# Patient Record
Sex: Female | Born: 1955
Health system: Southern US, Community
[De-identification: ages and names within clinical notes are randomized; demographics above are authoritative.]

## PROBLEM LIST (undated history)

## (undated) DIAGNOSIS — R87619 Unspecified abnormal cytological findings in specimens from cervix uteri: Secondary | ICD-10-CM

## (undated) DIAGNOSIS — I10 Essential (primary) hypertension: Secondary | ICD-10-CM | POA: Insufficient documentation

## (undated) DIAGNOSIS — E7800 Pure hypercholesterolemia, unspecified: Secondary | ICD-10-CM

## (undated) DIAGNOSIS — F329 Major depressive disorder, single episode, unspecified: Secondary | ICD-10-CM

## (undated) DIAGNOSIS — B0221 Postherpetic geniculate ganglionitis: Secondary | ICD-10-CM | POA: Insufficient documentation

## (undated) DIAGNOSIS — F32A Depression, unspecified: Secondary | ICD-10-CM

## (undated) DIAGNOSIS — F419 Anxiety disorder, unspecified: Secondary | ICD-10-CM

## (undated) DIAGNOSIS — K219 Gastro-esophageal reflux disease without esophagitis: Secondary | ICD-10-CM

## (undated) DIAGNOSIS — K862 Cyst of pancreas: Secondary | ICD-10-CM | POA: Insufficient documentation

## (undated) DIAGNOSIS — R1084 Generalized abdominal pain: Secondary | ICD-10-CM | POA: Insufficient documentation

## (undated) HISTORY — PX: SPLENECTOMY, TOTAL: SHX788

## (undated) HISTORY — PX: PANCREAS BIOPSY: SHX1018

## (undated) HISTORY — DX: Pure hypercholesterolemia, unspecified: E78.00

## (undated) HISTORY — DX: Anxiety disorder, unspecified: F41.9

## (undated) HISTORY — DX: Unspecified abnormal cytological findings in specimens from cervix uteri: R87.619

## (undated) HISTORY — DX: Major depressive disorder, single episode, unspecified: F32.9

## (undated) HISTORY — DX: Depression, unspecified: F32.A

## (undated) HISTORY — DX: Essential (primary) hypertension: I10

## (undated) HISTORY — DX: Gastro-esophageal reflux disease without esophagitis: K21.9

## (undated) HISTORY — PX: TUBAL LIGATION: SHX77

## (undated) HISTORY — PX: OTHER SURGICAL HISTORY: SHX169

---

## 2012-08-17 DIAGNOSIS — K219 Gastro-esophageal reflux disease without esophagitis: Secondary | ICD-10-CM | POA: Insufficient documentation

## 2012-08-17 DIAGNOSIS — F418 Other specified anxiety disorders: Secondary | ICD-10-CM | POA: Insufficient documentation

## 2012-09-02 DIAGNOSIS — M79609 Pain in unspecified limb: Secondary | ICD-10-CM | POA: Insufficient documentation

## 2012-09-02 DIAGNOSIS — I70209 Unspecified atherosclerosis of native arteries of extremities, unspecified extremity: Secondary | ICD-10-CM | POA: Insufficient documentation

## 2013-12-08 LAB — HM COLONOSCOPY

## 2014-06-30 DIAGNOSIS — I779 Disorder of arteries and arterioles, unspecified: Secondary | ICD-10-CM | POA: Insufficient documentation

## 2014-12-20 DIAGNOSIS — F4310 Post-traumatic stress disorder, unspecified: Secondary | ICD-10-CM | POA: Diagnosis not present

## 2015-01-02 DIAGNOSIS — F4321 Adjustment disorder with depressed mood: Secondary | ICD-10-CM | POA: Diagnosis not present

## 2015-01-02 DIAGNOSIS — Z5181 Encounter for therapeutic drug level monitoring: Secondary | ICD-10-CM | POA: Diagnosis not present

## 2015-01-02 DIAGNOSIS — N200 Calculus of kidney: Secondary | ICD-10-CM | POA: Diagnosis not present

## 2015-01-02 DIAGNOSIS — I739 Peripheral vascular disease, unspecified: Secondary | ICD-10-CM | POA: Diagnosis not present

## 2015-01-02 DIAGNOSIS — Z79899 Other long term (current) drug therapy: Secondary | ICD-10-CM | POA: Diagnosis not present

## 2015-01-02 DIAGNOSIS — D649 Anemia, unspecified: Secondary | ICD-10-CM | POA: Diagnosis not present

## 2015-01-02 DIAGNOSIS — M545 Low back pain: Secondary | ICD-10-CM | POA: Diagnosis not present

## 2015-01-02 DIAGNOSIS — I10 Essential (primary) hypertension: Secondary | ICD-10-CM | POA: Diagnosis not present

## 2015-01-03 DIAGNOSIS — F331 Major depressive disorder, recurrent, moderate: Secondary | ICD-10-CM | POA: Diagnosis not present

## 2015-01-20 DIAGNOSIS — N201 Calculus of ureter: Secondary | ICD-10-CM | POA: Diagnosis not present

## 2015-01-20 DIAGNOSIS — I10 Essential (primary) hypertension: Secondary | ICD-10-CM | POA: Diagnosis not present

## 2015-01-20 DIAGNOSIS — R319 Hematuria, unspecified: Secondary | ICD-10-CM | POA: Diagnosis not present

## 2015-01-20 DIAGNOSIS — R109 Unspecified abdominal pain: Secondary | ICD-10-CM | POA: Diagnosis not present

## 2015-01-20 DIAGNOSIS — K088 Other specified disorders of teeth and supporting structures: Secondary | ICD-10-CM | POA: Diagnosis not present

## 2015-01-20 DIAGNOSIS — K029 Dental caries, unspecified: Secondary | ICD-10-CM | POA: Diagnosis not present

## 2015-02-12 DIAGNOSIS — I10 Essential (primary) hypertension: Secondary | ICD-10-CM | POA: Diagnosis not present

## 2015-02-12 DIAGNOSIS — E780 Pure hypercholesterolemia: Secondary | ICD-10-CM | POA: Diagnosis not present

## 2015-02-12 DIAGNOSIS — M545 Low back pain: Secondary | ICD-10-CM | POA: Diagnosis not present

## 2015-02-12 DIAGNOSIS — K210 Gastro-esophageal reflux disease with esophagitis: Secondary | ICD-10-CM | POA: Diagnosis not present

## 2015-03-13 DIAGNOSIS — M5136 Other intervertebral disc degeneration, lumbar region: Secondary | ICD-10-CM | POA: Diagnosis not present

## 2015-03-15 DIAGNOSIS — E780 Pure hypercholesterolemia: Secondary | ICD-10-CM | POA: Diagnosis not present

## 2015-03-15 DIAGNOSIS — M545 Low back pain: Secondary | ICD-10-CM | POA: Diagnosis not present

## 2015-03-15 DIAGNOSIS — K210 Gastro-esophageal reflux disease with esophagitis: Secondary | ICD-10-CM | POA: Diagnosis not present

## 2015-03-15 DIAGNOSIS — I10 Essential (primary) hypertension: Secondary | ICD-10-CM | POA: Diagnosis not present

## 2015-04-11 DIAGNOSIS — E780 Pure hypercholesterolemia: Secondary | ICD-10-CM | POA: Diagnosis not present

## 2015-04-11 DIAGNOSIS — F4310 Post-traumatic stress disorder, unspecified: Secondary | ICD-10-CM | POA: Diagnosis not present

## 2015-04-11 DIAGNOSIS — I10 Essential (primary) hypertension: Secondary | ICD-10-CM | POA: Diagnosis not present

## 2015-04-11 DIAGNOSIS — F331 Major depressive disorder, recurrent, moderate: Secondary | ICD-10-CM | POA: Diagnosis not present

## 2015-04-11 DIAGNOSIS — K210 Gastro-esophageal reflux disease with esophagitis: Secondary | ICD-10-CM | POA: Diagnosis not present

## 2015-04-11 DIAGNOSIS — M545 Low back pain: Secondary | ICD-10-CM | POA: Diagnosis not present

## 2015-05-07 DIAGNOSIS — R319 Hematuria, unspecified: Secondary | ICD-10-CM | POA: Diagnosis not present

## 2015-05-07 DIAGNOSIS — Z7902 Long term (current) use of antithrombotics/antiplatelets: Secondary | ICD-10-CM | POA: Diagnosis not present

## 2015-05-07 DIAGNOSIS — Z87442 Personal history of urinary calculi: Secondary | ICD-10-CM | POA: Diagnosis not present

## 2015-05-07 DIAGNOSIS — N390 Urinary tract infection, site not specified: Secondary | ICD-10-CM | POA: Diagnosis not present

## 2015-05-07 DIAGNOSIS — F17200 Nicotine dependence, unspecified, uncomplicated: Secondary | ICD-10-CM | POA: Diagnosis not present

## 2015-05-07 DIAGNOSIS — I70229 Atherosclerosis of native arteries of extremities with rest pain, unspecified extremity: Secondary | ICD-10-CM | POA: Diagnosis not present

## 2015-05-07 DIAGNOSIS — K219 Gastro-esophageal reflux disease without esophagitis: Secondary | ICD-10-CM | POA: Diagnosis not present

## 2015-05-07 DIAGNOSIS — M546 Pain in thoracic spine: Secondary | ICD-10-CM | POA: Diagnosis not present

## 2015-05-22 DIAGNOSIS — F4310 Post-traumatic stress disorder, unspecified: Secondary | ICD-10-CM | POA: Diagnosis not present

## 2015-05-22 DIAGNOSIS — F331 Major depressive disorder, recurrent, moderate: Secondary | ICD-10-CM | POA: Diagnosis not present

## 2015-05-28 DIAGNOSIS — E780 Pure hypercholesterolemia: Secondary | ICD-10-CM | POA: Diagnosis not present

## 2015-05-28 DIAGNOSIS — K210 Gastro-esophageal reflux disease with esophagitis: Secondary | ICD-10-CM | POA: Diagnosis not present

## 2015-05-28 DIAGNOSIS — I10 Essential (primary) hypertension: Secondary | ICD-10-CM | POA: Diagnosis not present

## 2015-05-28 DIAGNOSIS — M545 Low back pain: Secondary | ICD-10-CM | POA: Diagnosis not present

## 2015-06-04 DIAGNOSIS — I739 Peripheral vascular disease, unspecified: Secondary | ICD-10-CM | POA: Diagnosis not present

## 2015-06-04 DIAGNOSIS — M5432 Sciatica, left side: Secondary | ICD-10-CM | POA: Diagnosis not present

## 2015-06-25 DIAGNOSIS — Z113 Encounter for screening for infections with a predominantly sexual mode of transmission: Secondary | ICD-10-CM | POA: Diagnosis not present

## 2015-07-03 DIAGNOSIS — F4310 Post-traumatic stress disorder, unspecified: Secondary | ICD-10-CM | POA: Diagnosis not present

## 2015-07-03 DIAGNOSIS — F331 Major depressive disorder, recurrent, moderate: Secondary | ICD-10-CM | POA: Diagnosis not present

## 2015-07-06 DIAGNOSIS — R87612 Low grade squamous intraepithelial lesion on cytologic smear of cervix (LGSIL): Secondary | ICD-10-CM | POA: Diagnosis not present

## 2015-07-06 DIAGNOSIS — R6882 Decreased libido: Secondary | ICD-10-CM | POA: Diagnosis not present

## 2015-07-06 DIAGNOSIS — Z01419 Encounter for gynecological examination (general) (routine) without abnormal findings: Secondary | ICD-10-CM | POA: Diagnosis not present

## 2015-07-06 DIAGNOSIS — Z79890 Hormone replacement therapy (postmenopausal): Secondary | ICD-10-CM | POA: Diagnosis not present

## 2015-07-06 DIAGNOSIS — R87810 Cervical high risk human papillomavirus (HPV) DNA test positive: Secondary | ICD-10-CM | POA: Diagnosis not present

## 2015-07-09 DIAGNOSIS — R87619 Unspecified abnormal cytological findings in specimens from cervix uteri: Secondary | ICD-10-CM

## 2015-07-09 HISTORY — DX: Unspecified abnormal cytological findings in specimens from cervix uteri: R87.619

## 2015-07-19 DIAGNOSIS — Z1231 Encounter for screening mammogram for malignant neoplasm of breast: Secondary | ICD-10-CM | POA: Diagnosis not present

## 2015-08-04 DIAGNOSIS — R0789 Other chest pain: Secondary | ICD-10-CM | POA: Diagnosis not present

## 2015-08-04 DIAGNOSIS — F17210 Nicotine dependence, cigarettes, uncomplicated: Secondary | ICD-10-CM | POA: Diagnosis not present

## 2015-08-04 DIAGNOSIS — I10 Essential (primary) hypertension: Secondary | ICD-10-CM | POA: Diagnosis not present

## 2015-08-04 DIAGNOSIS — E780 Pure hypercholesterolemia: Secondary | ICD-10-CM | POA: Diagnosis not present

## 2015-08-04 DIAGNOSIS — R079 Chest pain, unspecified: Secondary | ICD-10-CM | POA: Diagnosis not present

## 2015-08-04 DIAGNOSIS — J984 Other disorders of lung: Secondary | ICD-10-CM | POA: Diagnosis not present

## 2015-08-04 DIAGNOSIS — K219 Gastro-esophageal reflux disease without esophagitis: Secondary | ICD-10-CM | POA: Diagnosis not present

## 2015-08-08 DIAGNOSIS — Z719 Counseling, unspecified: Secondary | ICD-10-CM | POA: Diagnosis not present

## 2015-08-08 DIAGNOSIS — R87810 Cervical high risk human papillomavirus (HPV) DNA test positive: Secondary | ICD-10-CM | POA: Diagnosis not present

## 2015-08-08 DIAGNOSIS — R87612 Low grade squamous intraepithelial lesion on cytologic smear of cervix (LGSIL): Secondary | ICD-10-CM | POA: Diagnosis not present

## 2015-08-08 DIAGNOSIS — N870 Mild cervical dysplasia: Secondary | ICD-10-CM | POA: Diagnosis not present

## 2015-08-15 DIAGNOSIS — Z206 Contact with and (suspected) exposure to human immunodeficiency virus [HIV]: Secondary | ICD-10-CM | POA: Diagnosis not present

## 2015-11-26 DIAGNOSIS — I779 Disorder of arteries and arterioles, unspecified: Secondary | ICD-10-CM | POA: Diagnosis not present

## 2015-11-26 DIAGNOSIS — I739 Peripheral vascular disease, unspecified: Secondary | ICD-10-CM | POA: Diagnosis not present

## 2016-01-04 ENCOUNTER — Ambulatory Visit: Payer: Self-pay | Admitting: Family Medicine

## 2016-01-04 DIAGNOSIS — R42 Dizziness and giddiness: Secondary | ICD-10-CM | POA: Diagnosis not present

## 2016-01-04 DIAGNOSIS — I10 Essential (primary) hypertension: Secondary | ICD-10-CM | POA: Diagnosis not present

## 2016-01-04 DIAGNOSIS — R110 Nausea: Secondary | ICD-10-CM | POA: Diagnosis not present

## 2016-01-04 DIAGNOSIS — E7800 Pure hypercholesterolemia, unspecified: Secondary | ICD-10-CM | POA: Diagnosis not present

## 2016-01-04 DIAGNOSIS — F418 Other specified anxiety disorders: Secondary | ICD-10-CM | POA: Diagnosis not present

## 2016-01-04 DIAGNOSIS — R1012 Left upper quadrant pain: Secondary | ICD-10-CM | POA: Diagnosis not present

## 2016-01-04 DIAGNOSIS — Z7982 Long term (current) use of aspirin: Secondary | ICD-10-CM | POA: Diagnosis not present

## 2016-01-04 DIAGNOSIS — Z79899 Other long term (current) drug therapy: Secondary | ICD-10-CM | POA: Diagnosis not present

## 2016-01-04 DIAGNOSIS — K219 Gastro-esophageal reflux disease without esophagitis: Secondary | ICD-10-CM | POA: Diagnosis not present

## 2016-01-04 DIAGNOSIS — I739 Peripheral vascular disease, unspecified: Secondary | ICD-10-CM | POA: Diagnosis not present

## 2016-01-04 DIAGNOSIS — Z9889 Other specified postprocedural states: Secondary | ICD-10-CM | POA: Diagnosis not present

## 2016-01-04 DIAGNOSIS — Z7902 Long term (current) use of antithrombotics/antiplatelets: Secondary | ICD-10-CM | POA: Diagnosis not present

## 2016-01-04 DIAGNOSIS — R143 Flatulence: Secondary | ICD-10-CM | POA: Diagnosis not present

## 2016-01-04 DIAGNOSIS — K2960 Other gastritis without bleeding: Secondary | ICD-10-CM | POA: Diagnosis not present

## 2016-01-04 DIAGNOSIS — R142 Eructation: Secondary | ICD-10-CM | POA: Diagnosis not present

## 2016-01-09 ENCOUNTER — Encounter: Payer: Self-pay | Admitting: Obstetrics and Gynecology

## 2016-01-15 LAB — HM PAP SMEAR: HM Pap smear: NEGATIVE

## 2016-01-16 ENCOUNTER — Ambulatory Visit (INDEPENDENT_AMBULATORY_CARE_PROVIDER_SITE_OTHER): Payer: Medicare Other | Admitting: Obstetrics and Gynecology

## 2016-01-16 ENCOUNTER — Encounter: Payer: Self-pay | Admitting: Obstetrics and Gynecology

## 2016-01-16 VITALS — BP 150/79 | HR 73 | Ht 65.5 in | Wt 166.6 lb

## 2016-01-16 DIAGNOSIS — R351 Nocturia: Secondary | ICD-10-CM | POA: Diagnosis not present

## 2016-01-16 DIAGNOSIS — A499 Bacterial infection, unspecified: Secondary | ICD-10-CM | POA: Diagnosis not present

## 2016-01-16 DIAGNOSIS — B9689 Other specified bacterial agents as the cause of diseases classified elsewhere: Secondary | ICD-10-CM

## 2016-01-16 DIAGNOSIS — Z0000 Encounter for general adult medical examination without abnormal findings: Secondary | ICD-10-CM | POA: Diagnosis not present

## 2016-01-16 DIAGNOSIS — N760 Acute vaginitis: Secondary | ICD-10-CM

## 2016-01-16 DIAGNOSIS — Z1239 Encounter for other screening for malignant neoplasm of breast: Secondary | ICD-10-CM

## 2016-01-16 DIAGNOSIS — N3289 Other specified disorders of bladder: Secondary | ICD-10-CM | POA: Diagnosis not present

## 2016-01-16 DIAGNOSIS — Z206 Contact with and (suspected) exposure to human immunodeficiency virus [HIV]: Secondary | ICD-10-CM

## 2016-01-16 DIAGNOSIS — Z1211 Encounter for screening for malignant neoplasm of colon: Secondary | ICD-10-CM

## 2016-01-16 DIAGNOSIS — Z01419 Encounter for gynecological examination (general) (routine) without abnormal findings: Secondary | ICD-10-CM

## 2016-01-16 DIAGNOSIS — R87612 Low grade squamous intraepithelial lesion on cytologic smear of cervix (LGSIL): Secondary | ICD-10-CM | POA: Diagnosis not present

## 2016-01-16 LAB — POCT URINALYSIS DIPSTICK
Bilirubin, UA: 1
CLARITY UA: NEGATIVE
Glucose, UA: NEGATIVE
KETONES UA: NEGATIVE
NITRITE UA: NEGATIVE
PH UA: 7
PROTEIN UA: NEGATIVE
Spec Grav, UA: 1.01
Urobilinogen, UA: 0.2

## 2016-01-16 MED ORDER — METRONIDAZOLE 500 MG PO TABS: 500.0000 mg | ORAL_TABLET | Freq: Two times a day (BID) | ORAL | Status: DC

## 2016-01-16 MED ORDER — OXYBUTYNIN CHLORIDE 5 MG PO TABS: 5.0000 mg | ORAL_TABLET | Freq: Two times a day (BID) | ORAL | Status: DC

## 2016-01-16 NOTE — Progress Notes (Signed)
Patient ID: Peggy Russo, female   DOB: 1956/07/01, 60 y.o.   MRN: [id number] ANNUAL PREVENTATIVE CARE GYN  ENCOUNTER NOTE  Subjective:       Peggy Russo is a 60 y.o. G29P4005 female here for a routine annual gynecologic exam.  Current complaints: 1.  Vaginal odor x2 weeks: Odor described as sour and fishy with white thick discharge. Denies vaginal bleeding, itching, irritation. Denies pain.  2. Nocturia x 2-3 weeks: states with frequent urination >10 times a night. Urge symptoms at night. No increase in frequency during the day. Denies dysuria, hematuria, trouble voiding. With history of stress symptoms, currently denies any leakage with coughing or laughing.   3. Nausea x2 weeks: Patient was seen at the emergency department due to nausea and unable to keep food down. Patient states symptoms could be related stress. ED ruled symptoms as gastritis and acid reflux. Patient is an everyday THC user, states uses THC to increase appetite and help keep food down. Patient's last colonoscopy 2 years ago with polyps.   4. Trouble sleeping: Patient has been experiencing insomnia for many years, feels that it has worsened in the past few weeks due to stress. Patient with history use of Sertraline, Mirtazapine and Trazodone.    Gynecologic History No LMP recorded. Patient is postmenopausal. Contraception: post menopausal status  Currently not sexually active Patient with HIV exposure; serial HIV testing is negative Last Pap: 2016. Results were: abnormal- per pt needed leep but did not get one Last mammogram: 2016. Results were: normal  Obstetric History OB History  Gravida Para Term Preterm AB SAB TAB Ectopic Multiple Living  5 4 4       5     # Outcome Date GA Lbr Len/2nd Weight Sex Delivery Anes PTL Lv  5 Term 1985   6 lb (2.722 kg) M CS-LTranv   Y  4 Term 1982   6 lb (2.722 kg) M Vag-Spont   Y  3 Term 1981   7 lb (3.175 kg) M Vag-Spont   Y  2 Gravida 1977   6 lb (2.722 kg) F Vag-Spont   Y  1 Term  1973   6 lb 2.1 oz (2.781 kg) M Vag-Spont   Y      Past Medical History  Diagnosis Date  . Hypertension   . Anxiety   . Depression   . GERD (gastroesophageal reflux disease)   . High cholesterol   . Abnormal Pap smear of cervix 07/2015    needed Leep- did not get one    Past Surgical History  Procedure Laterality Date  . Stent leg    . Splenectomy, total    . Cesarean section    . Tubal ligation      No current outpatient prescriptions on file prior to visit.   No current facility-administered medications on file prior to visit.    Allergies  Allergen Reactions  . Acetaminophen Itching    Patient states that she tolerates Percocet (oxycodone/APAP).  . Hydromorphone Other (See Comments)    Social History   Social History  . Marital Status: Legally Separated    Spouse Name: N/A  . Number of Children: N/A  . Years of Education: N/A   Occupational History  . Not on file.   Social History Main Topics  . Smoking status: Current Every Day Smoker -- 0.50 packs/day for 25 years    Types: Cigarettes  . Smokeless tobacco: Not on file  . Alcohol Use: No  . Drug Use:  Yes    Special: Marijuana     Comment: daily  . Sexual Activity: No   Other Topics Concern  . Not on file   Social History Narrative  . No narrative on file    Family History  Problem Relation Age of Onset  . Hypertension Mother   . Cancer Neg Hx   . Diabetes Neg Hx   . Ovarian cancer Neg Hx   . Heart disease Neg Hx     The following portions of the patient's history were reviewed and updated as appropriate: allergies, current medications, past family history, past medical history, past social history, past surgical history and problem list.  Review of Systems ROS- See above  Objective:   BP 150/79 mmHg  Pulse 73  Ht 5' 5.5" (1.664 m)  Wt 166 lb 9.6 oz (75.569 kg)  BMI 27.29 kg/m2 CONSTITUTIONAL: Well-developed, well-nourished female in no acute distress.  PSYCHIATRIC: Normal mood  and affect. Normal behavior. Normal judgment and thought content. [place]: Alert and oriented to person, place, and time. Normal muscle tone coordination. No cranial nerve deficit noted. HENT:  Normocephalic, atraumatic. Oropharynx is clear and moist NECK: Normal range of motion, supple, no masses.  Normal thyroid.  SKIN: Skin is warm and dry. No rash noted. Not diaphoretic. No erythema. No pallor. CARDIOVASCULAR: Normal heart rate noted, regular rhythm, no murmur. RESPIRATORY: Clear to auscultation bilaterally. Effort and breath sounds normal, no problems with respiration noted. BREASTS: Round, distinct, mobile and nontender BB sized mass at 4 o'clock beside nipple. Symmetric in size. No skin changes, nipple drainage, or lymphadenopathy. Stable per patient history. ABDOMEN: Soft, normal bowel sounds, no distention noted.  No tenderness, rebound or guarding.  BLADDER: Normal PELVIC:  External Genitalia: Normal  BUS: Normal  Vagina: Normal  Cervix: Normal. White discharge. Positive whiff test.  Uterus: Normal. Midline, mobile, nontender.   Adnexa: Normal  RV: External Exam NormaI, No Rectal Masses and Normal Sphincter tone  LYMPHATIC: No Axillary, Supraclavicular, or Inguinal Adenopathy.  PROCEDURE:  Wet prep. KOH-negative; positive whiff test Normal saline-positive clue cells; few white blood cells; no Trichomonas     Assessment:   1. Annual/Wellness examination - Pap Cotest - Breast cancer screening - Colon cancer screening  2. Vaginal Malodor- Bacterial vaginosis - Positive Whiff test - Wet prep: positive for clue cells  3. Nocturia - UA and cultures to rule out cystitis - Trial of Oxybutynin for symptoms of over active bladder  4. HIV exposure - Blood drawn today   Plan:  1. Pap: Pap Co Test 2. Mammogram: Ordered 3. Stool Guaiac Testing:  Ordered 4. Labs: HIV testing 5. Start Flagyl 500mg  BID PO x 7 days for bacterial vaginosis 6. Start Oxybutynin 5mg  BID PO  for overactive bladder symptoms. 7. Side effects of medicines discussed in detail with patient 8. Routine preventative health maintenance measures emphasized: Exercise/Diet/Weight control, Tobacco Warnings, Alcohol/Substance use risks, Stress Management, Peer Pressure Issues and Safe Sex 9. Follow up in 6 weeks for management 10.  Annual exam 1 year  Joyice Faster, CMA Amy Tasia Catchings, PA-S Brayton Mars, MD   I have seen, interviewed, and examined the patient in conjunction with the [facility].A. student and affirm the diagnosis and management plan. [place] A. DeFrancesco, MD, FACOG   Note: This dictation was prepared with Dragon dictation along with smaller phrase technology. Any transcriptional errors that result from this process are unintentional.

## 2016-01-17 LAB — HIV ANTIBODY (ROUTINE TESTING W REFLEX): HIV SCREEN 4TH GENERATION: NONREACTIVE

## 2016-01-17 LAB — URINE CULTURE

## 2016-01-21 DIAGNOSIS — B9689 Other specified bacterial agents as the cause of diseases classified elsewhere: Secondary | ICD-10-CM | POA: Insufficient documentation

## 2016-01-21 DIAGNOSIS — N760 Acute vaginitis: Secondary | ICD-10-CM

## 2016-01-21 DIAGNOSIS — I70209 Unspecified atherosclerosis of native arteries of extremities, unspecified extremity: Secondary | ICD-10-CM | POA: Insufficient documentation

## 2016-01-21 DIAGNOSIS — R351 Nocturia: Secondary | ICD-10-CM | POA: Insufficient documentation

## 2016-01-21 NOTE — Patient Instructions (Signed)
1.  Pap smear is done today. 2.  Mammogram is ordered. 3.  Stool guaiac cards are given. 4.  Begin oxybutynin 5 mg twice a day. 6.  HIV testing is done today. 7.  Return in 6 weeks for follow-up on oxybutynin. 8.  Return one year for annual exam

## 2016-01-22 ENCOUNTER — Ambulatory Visit (INDEPENDENT_AMBULATORY_CARE_PROVIDER_SITE_OTHER): Payer: Medicare Other | Admitting: Family Medicine

## 2016-01-22 ENCOUNTER — Encounter: Payer: Self-pay | Admitting: Family Medicine

## 2016-01-22 ENCOUNTER — Other Ambulatory Visit: Payer: Self-pay | Admitting: Family Medicine

## 2016-01-22 VITALS — BP 131/79 | HR 69 | Temp 98.4°F | Resp 16 | Ht 65.5 in | Wt 171.0 lb

## 2016-01-22 DIAGNOSIS — R5382 Chronic fatigue, unspecified: Secondary | ICD-10-CM | POA: Diagnosis not present

## 2016-01-22 DIAGNOSIS — I10 Essential (primary) hypertension: Secondary | ICD-10-CM

## 2016-01-22 DIAGNOSIS — N3281 Overactive bladder: Secondary | ICD-10-CM

## 2016-01-22 DIAGNOSIS — K219 Gastro-esophageal reflux disease without esophagitis: Secondary | ICD-10-CM

## 2016-01-22 DIAGNOSIS — F329 Major depressive disorder, single episode, unspecified: Secondary | ICD-10-CM

## 2016-01-22 DIAGNOSIS — I70209 Unspecified atherosclerosis of native arteries of extremities, unspecified extremity: Secondary | ICD-10-CM

## 2016-01-22 DIAGNOSIS — E559 Vitamin D deficiency, unspecified: Secondary | ICD-10-CM | POA: Diagnosis not present

## 2016-01-22 DIAGNOSIS — G4700 Insomnia, unspecified: Secondary | ICD-10-CM | POA: Diagnosis not present

## 2016-01-22 DIAGNOSIS — F32A Depression, unspecified: Secondary | ICD-10-CM

## 2016-01-22 MED ORDER — MIRTAZAPINE 15 MG PO TABS: 15.0000 mg | ORAL_TABLET | Freq: Every day | ORAL | Status: DC

## 2016-01-22 MED ORDER — TOLTERODINE TARTRATE ER 2 MG PO CP24: 2.0000 mg | ORAL_CAPSULE | Freq: Every day | ORAL | Status: DC

## 2016-01-22 MED ORDER — CLOPIDOGREL BISULFATE 75 MG PO TABS: 75.0000 mg | ORAL_TABLET | Freq: Once | ORAL | Status: DC

## 2016-01-22 MED ORDER — AMLODIPINE BESYLATE 10 MG PO TABS: 10.0000 mg | ORAL_TABLET | Freq: Every day | ORAL | Status: DC

## 2016-01-22 MED ORDER — PANTOPRAZOLE SODIUM 20 MG PO TBEC: 20.0000 mg | DELAYED_RELEASE_TABLET | Freq: Every day | ORAL | Status: DC

## 2016-01-22 MED ORDER — SIMVASTATIN 20 MG PO TABS: 20.0000 mg | ORAL_TABLET | Freq: Every day | ORAL | Status: DC

## 2016-01-22 NOTE — Assessment & Plan Note (Signed)
Renewed Protonix. Alarm symptoms reviewed.

## 2016-01-22 NOTE — Assessment & Plan Note (Addendum)
Controlled in the office today. Continue amlodipine daily. CMET. Reviewed DASH diet.  Recheck 3 mos.

## 2016-01-22 NOTE — Patient Instructions (Signed)
Bladder issues: Try stopping drinking large amounts after 7pm to see if that helps you void less. Change to Detrol 2mg  at bedtime to help with your symptoms.  We will check your Vitamin D and TSH levels for your fatigue.  It may also be depression- we can plan to restart Zoloft if those are normal.   Your goal blood pressure is 140/90 Work on low salt/sodium diet - goal <1.5gm (1,500mg ) per day. Eat a diet high in fruits/vegetables and whole grains.  Look into mediterranean and DASH diet. Goal activity is 128min/wk of moderate intensity exercise.  This can be split into 30 minute chunks.  If you are not at this level, you can start with smaller 10-15 min increments and slowly build up activity. Look at [employer] for more resources   Please seek immediate medical attention at ER or Urgent Care if you develop: Chest pain, pressure or tightness. Shortness of breath accompanied by nausea or diaphoresis Visual changes Numbness or tingling on one side of the body Facial droop Altered mental status Or any concerning symptoms.

## 2016-01-22 NOTE — Assessment & Plan Note (Signed)
Pt stopped Zoloft on her own. Suspect fatigue may be depression related. Will check TSH and vitamin D to r/o deficiency and discuss restarting Zoloft.

## 2016-01-22 NOTE — Assessment & Plan Note (Signed)
Renewed remeron for sleep.

## 2016-01-22 NOTE — Progress Notes (Signed)
Subjective:    Patient ID: Peggy Russo, female    DOB: [birth year], 60 y.o.   MRN: [id number]  HPI: Peggy Russo is a 60 y.o. female presenting on 01/22/2016 for Establish Care   HPI  Pt presents to establish care today. Previous care provider was Dr. Enzo Bi and PCP in [facility].  It has been 3 months since Her last PCP visit. Records from previous provider will be requested and reviewed. Current medical problems include:  Hypertension: Diagnosed in 2009. Taking amlodipine 10mg  daily. Works well for her. No chest pain, shortness of breath.  Nocturia:x2 weeks- doesn't sleep well due to needing to use bathroom. Was given antibiotic by GYN- no change in symptoms. Started on oxybutinin- worked for a few days and then symptoms restarted. Symptoms include urgency, waking 4-5 times per night to void. Normal urine. Trying to drink water- has increased water since her GYN visit. Stops drinking around 9pm. Goes to bed at 11-12pm.   Fatigue- feels lifeless and drained of energy GERD: On protonix 20mg - doing well. Previously had nausea. Was on zofran, stopped since started protonix.  PAD: Taking plavix for PAD. Taking aspirin. Sees vascular specialist in [place]. Diagnosed 4 years ago.  Stent in [place]. Saw vascular every year. Not on a statin.  Neurology- has appt  Friday in [place] for nerve issues and numbness LLE. Sitting and goes numb. Vascular sent her to neurology. Depression: Started on Zoloft- when she got married 2.5 years ago. Feels like it is helping- not sure that she needed. She has recently left her husband and anxiety stopped. Stopped zoloft 1 mos ago.  Sleep- Taking remeron for sleep 15mg  at bedtime. Worked well in the past.   Health maintenance:  GYN: Pap done last month. Previous LSIL.  Mammogram ordered by GYN.  Colonscopy- scheduled for next month.   Past Medical History  Diagnosis Date  . Hypertension   . Anxiety   . Depression   . GERD (gastroesophageal  reflux disease)   . High cholesterol   . Abnormal Pap smear of cervix 07/2015    needed Leep- did not get one   Social History   Social History  . Marital Status: Legally Separated    Spouse Name: N/A  . Number of Children: N/A  . Years of Education: N/A   Occupational History  . Not on file.   Social History Main Topics  . Smoking status: Current Every Day Smoker -- 0.50 packs/day for 25 years    Types: Cigarettes  . Smokeless tobacco: Not on file  . Alcohol Use: No  . Drug Use: Yes    Special: Marijuana     Comment: daily  . Sexual Activity: No   Other Topics Concern  . Not on file   Social History Narrative   Family History  Problem Relation Age of Onset  . Hypertension Mother   . Cancer Neg Hx   . Diabetes Neg Hx   . Ovarian cancer Neg Hx   . Heart disease Neg Hx    Current Outpatient Prescriptions on File Prior to Visit  Medication Sig  . aspirin EC 81 MG tablet Take by mouth.  . metroNIDAZOLE (FLAGYL) 500 MG tablet Take 1 tablet (500 mg total) by mouth 2 (two) times daily.  . sertraline (ZOLOFT) 50 MG tablet    No current facility-administered medications on file prior to visit.    Review of Systems  Constitutional: Positive for fatigue. Negative for fever and chills.  HENT:  Negative.   Respiratory: Negative for cough, chest tightness and wheezing.   Cardiovascular: Negative for chest pain, palpitations and leg swelling.  Gastrointestinal: Negative for nausea, vomiting, abdominal pain, diarrhea and constipation.  Endocrine: Negative.  Negative for cold intolerance, heat intolerance, polydipsia, polyphagia and polyuria.  Genitourinary: Positive for urgency and frequency. Negative for dysuria, vaginal bleeding, vaginal discharge, difficulty urinating, vaginal pain, pelvic pain and dyspareunia.  Musculoskeletal: Negative.   Skin: Negative for color change.  Neurological: Negative for dizziness, light-headedness and numbness.  Psychiatric/Behavioral:  Positive for sleep disturbance.   Per HPI unless specifically indicated above     Objective:    BP 131/79 mmHg  Pulse 69  Temp(Src) 98.4 F (36.9 C) (Oral)  Resp 16  Ht 5' 5.5" (1.664 m)  Wt 171 lb (77.565 kg)  BMI 28.01 kg/m2  Wt Readings from Last 3 Encounters:  01/22/16 171 lb (77.565 kg)  01/16/16 166 lb 9.6 oz (75.569 kg)    Physical Exam  Constitutional: She is oriented to person, place, and time. She appears well-developed and well-nourished.  HENT:  Head: Normocephalic and atraumatic.  Neck: Neck supple.  Cardiovascular: Normal rate, regular rhythm and normal heart sounds.  Exam reveals no gallop and no friction rub.   No murmur heard. Pulmonary/Chest: Effort normal and breath sounds normal. She has no wheezes. She exhibits no tenderness.  Abdominal: Soft. Normal appearance and bowel sounds are normal. She exhibits no distension and no mass. There is no tenderness. There is no rebound, no guarding and no CVA tenderness.  Musculoskeletal: Normal range of motion. She exhibits no edema or tenderness.  Lymphadenopathy:    She has no cervical adenopathy.  Neurological: She is alert and oriented to person, place, and time.  Skin: Skin is warm and dry.   Results for orders placed or performed in visit on 01/22/16  HM PAP SMEAR  Result Value Ref Range   HM Pap smear negative   HM COLONOSCOPY  Result Value Ref Range   HM Colonoscopy polyp       Assessment & Plan:   Problem List Items Addressed This Visit      Cardiovascular and Mediastinum   Arteriosclerosis of artery of extremity ([place])    Followed by vascular surgery. Pt would like to see local vascular surgeon. Just had follow up last month- need recheck in 1 year.  She will call when she needs referral to local surgeon.       Relevant Medications   amLODipine (NORVASC) 10 MG tablet   simvastatin (ZOCOR) 20 MG tablet   clopidogrel (PLAVIX) 75 MG tablet   Other Relevant Orders   Lipid panel   Benign  essential HTN - Primary    Controlled in the office today. Continue amlodipine daily. CMET. Reviewed DASH diet.  Recheck 3 mos.       Relevant Medications   amLODipine (NORVASC) 10 MG tablet   simvastatin (ZOCOR) 20 MG tablet   Other Relevant Orders   Comprehensive metabolic panel     Digestive   Acid reflux    Renewed Protonix. Alarm symptoms reviewed.       Relevant Medications   pantoprazole (PROTONIX) 20 MG tablet     Other   Clinical depression    Pt stopped Zoloft on her own. Suspect fatigue may be depression related. Will check TSH and vitamin D to r/o deficiency and discuss restarting Zoloft.       Relevant Medications   mirtazapine (REMERON) 15 MG tablet   Vitamin  D deficiency    Recheck vitamin D levels for fatigue.       Relevant Orders   VITAMIN D 25 Hydroxy (Vit-D Deficiency, Fractures)   Insomnia    Renewed remeron for sleep.        Other Visit Diagnoses    Overactive bladder        ditropan is not helping- change to detrol LA to see if symptoms can be controlled. Consider urology referral if not improving.     Relevant Medications    tolterodine (DETROL LA) 2 MG 24 hr capsule    Chronic fatigue        Relevant Orders    VITAMIN D 25 Hydroxy (Vit-D Deficiency, Fractures)    TSH       Meds ordered this encounter  Medications  . tolterodine (DETROL LA) 2 MG 24 hr capsule    Sig: Take 1 capsule (2 mg total) by mouth daily.    Dispense:  30 capsule    Refill:  11    Order Specific Question:  Supervising Provider    Answer:  Arlis Porta [phone]  . pantoprazole (PROTONIX) 20 MG tablet    Sig: Take 1 tablet (20 mg total) by mouth daily.    Dispense:  90 tablet    Refill:  3    Order Specific Question:  Supervising Provider    Answer:  Arlis Porta [phone]  . amLODipine (NORVASC) 10 MG tablet    Sig: Take 1 tablet (10 mg total) by mouth daily.    Dispense:  90 tablet    Refill:  3    Order Specific Question:  Supervising  Provider    Answer:  Arlis Porta [phone]  . simvastatin (ZOCOR) 20 MG tablet    Sig: Take 1 tablet (20 mg total) by mouth daily at 6 PM.    Dispense:  90 tablet    Refill:  3    Order Specific Question:  Supervising Provider    Answer:  Arlis Porta [phone]  . clopidogrel (PLAVIX) 75 MG tablet    Sig: Take 1 tablet (75 mg total) by mouth once.    Dispense:  90 tablet    Refill:  3    Order Specific Question:  Supervising Provider    Answer:  Arlis Porta [phone]  . mirtazapine (REMERON) 15 MG tablet    Sig: Take 1 tablet (15 mg total) by mouth at bedtime.    Dispense:  90 tablet    Refill:  3    Order Specific Question:  Supervising Provider    Answer:  Arlis Porta [phone]      Follow up plan: Return in about 4 weeks (around 02/19/2016) for bladder.Marland Kitchen

## 2016-01-22 NOTE — Assessment & Plan Note (Signed)
Followed by vascular surgery. Pt would like to see local vascular surgeon. Just had follow up last month- need recheck in 1 year.  She will call when she needs referral to local surgeon.

## 2016-01-22 NOTE — Assessment & Plan Note (Signed)
Recheck vitamin D levels for fatigue.

## 2016-01-25 DIAGNOSIS — G5712 Meralgia paresthetica, left lower limb: Secondary | ICD-10-CM | POA: Diagnosis not present

## 2016-01-25 LAB — PAP IG AND HPV HIGH-RISK
HPV, high-risk: POSITIVE — AB
PAP SMEAR COMMENT: 0

## 2016-01-27 DIAGNOSIS — Z1211 Encounter for screening for malignant neoplasm of colon: Secondary | ICD-10-CM | POA: Diagnosis not present

## 2016-01-30 ENCOUNTER — Telehealth: Payer: Self-pay | Admitting: Family Medicine

## 2016-01-30 NOTE — Telephone Encounter (Signed)
Called pt and she doesn't have any preference do you want this referral to be placed ?

## 2016-01-30 NOTE — Telephone Encounter (Signed)
Pt saw a neurologist at [facility] and was told to follow up with a neurologist in [facility].  Please call [phone]

## 2016-01-31 NOTE — Telephone Encounter (Signed)
Well she only has one option and that is [facility].  I will placed a referral for her but we will need office notes from the [facility] neurologist and the diagnosis for which they want her to be seen.  Please call her to obtain that information and I will placed the referral. Thanks! AK

## 2016-01-31 NOTE — Telephone Encounter (Signed)
Called pt and told her that she will need to fax [place] notes before we schedule her appointment and also she wanted the Rx for bacterial infection to be send and she was here recently and got pap smear done. When question about having infection while she had seen her reply was she just got infection today and I advised her that she needs to be seen for any infection and any Rx for bacterial infection Nisha

## 2016-01-31 NOTE — Telephone Encounter (Signed)
Did she give [place] the name of the practice so we can request records? I know she was going for leg numbness but it was touched on briefly in her visit due to her having an existing neurology referral.  Thanks for the FYI about infection- I agree she needs to be seen.

## 2016-02-01 NOTE — Telephone Encounter (Signed)
Records from previous neurologist received and will be placed on A.Krebs desk.

## 2016-02-04 ENCOUNTER — Other Ambulatory Visit: Payer: Self-pay | Admitting: Family Medicine

## 2016-02-04 DIAGNOSIS — G5712 Meralgia paresthetica, left lower limb: Secondary | ICD-10-CM

## 2016-02-04 LAB — FECAL OCCULT BLOOD, IMMUNOCHEMICAL: Fecal Occult Bld: NEGATIVE

## 2016-02-05 ENCOUNTER — Telehealth: Payer: Self-pay | Admitting: Obstetrics and Gynecology

## 2016-02-05 MED ORDER — METRONIDAZOLE 500 MG PO TABS: 500.0000 mg | ORAL_TABLET | Freq: Two times a day (BID) | ORAL | Status: DC

## 2016-02-05 NOTE — Telephone Encounter (Signed)
Pt was seen 2/8 for ww exam. Dx with bv. Given flagyl. Pt states she completed all meds. Now she is having a fishy odor. NO d/c. Advised I will send in flagyl again. If she completes meds and sx reoccur she will need to be seen.

## 2016-02-05 NOTE — Telephone Encounter (Signed)
Patient called stating she has a "fishy" odor and didn't know why. She would like a call back.Thanks

## 2016-02-22 DIAGNOSIS — I70209 Unspecified atherosclerosis of native arteries of extremities, unspecified extremity: Secondary | ICD-10-CM | POA: Diagnosis not present

## 2016-02-22 DIAGNOSIS — R5382 Chronic fatigue, unspecified: Secondary | ICD-10-CM | POA: Diagnosis not present

## 2016-02-22 DIAGNOSIS — E559 Vitamin D deficiency, unspecified: Secondary | ICD-10-CM | POA: Diagnosis not present

## 2016-02-22 DIAGNOSIS — Z789 Other specified health status: Secondary | ICD-10-CM | POA: Diagnosis not present

## 2016-02-22 LAB — BASIC METABOLIC PANEL
Creatinine: 1 mg/dL (ref <=1.1)
GLUCOSE: 97 mg/dL

## 2016-02-22 LAB — HEPATIC FUNCTION PANEL
ALT: 27 U/L (ref 7–35)
AST: 15 U/L (ref 13–35)

## 2016-02-22 LAB — LIPID PANEL
CHOLESTEROL: 295 mg/dL — AB (ref 0–200)
HDL: 74 mg/dL — AB (ref 35–70)
LDL Cholesterol: 180 mg/dL
TRIGLYCERIDES: 203 mg/dL — AB (ref 40–160)

## 2016-02-26 ENCOUNTER — Other Ambulatory Visit: Payer: Self-pay | Admitting: Family Medicine

## 2016-02-26 ENCOUNTER — Ambulatory Visit (INDEPENDENT_AMBULATORY_CARE_PROVIDER_SITE_OTHER): Payer: Medicare Other | Admitting: Family Medicine

## 2016-02-26 ENCOUNTER — Ambulatory Visit
Admission: RE | Admit: 2016-02-26 | Discharge: 2016-02-26 | Disposition: A | Discharge status: Home / Self Care | Payer: Medicare Other | Source: Ambulatory Visit | Attending: Family Medicine | Admitting: Family Medicine

## 2016-02-26 ENCOUNTER — Encounter: Payer: Self-pay | Admitting: Family Medicine

## 2016-02-26 VITALS — BP 137/78 | HR 89 | Temp 98.7°F | Resp 16 | Wt 169.4 lb

## 2016-02-26 DIAGNOSIS — R42 Dizziness and giddiness: Secondary | ICD-10-CM

## 2016-02-26 DIAGNOSIS — K5900 Constipation, unspecified: Secondary | ICD-10-CM | POA: Diagnosis not present

## 2016-02-26 DIAGNOSIS — R1013 Epigastric pain: Secondary | ICD-10-CM | POA: Diagnosis not present

## 2016-02-26 DIAGNOSIS — R351 Nocturia: Secondary | ICD-10-CM

## 2016-02-26 DIAGNOSIS — K5909 Other constipation: Secondary | ICD-10-CM

## 2016-02-26 DIAGNOSIS — R0789 Other chest pain: Secondary | ICD-10-CM | POA: Diagnosis not present

## 2016-02-26 DIAGNOSIS — I70209 Unspecified atherosclerosis of native arteries of extremities, unspecified extremity: Secondary | ICD-10-CM | POA: Diagnosis not present

## 2016-02-26 DIAGNOSIS — R109 Unspecified abdominal pain: Secondary | ICD-10-CM | POA: Diagnosis not present

## 2016-02-26 MED ORDER — ATORVASTATIN CALCIUM 40 MG PO TABS
40.0000 mg | ORAL_TABLET | Freq: Every day | ORAL | Status: DC
Start: 2016-02-26 — End: 2017-02-02

## 2016-02-26 MED ORDER — LUBIPROSTONE 8 MCG PO CAPS
8.0000 ug | ORAL_CAPSULE | Freq: Two times a day (BID) | ORAL | Status: DC
Start: 2016-02-26 — End: 2016-12-10

## 2016-02-26 MED ORDER — BISACODYL 5 MG PO TBEC: 5.0000 mg | DELAYED_RELEASE_TABLET | Freq: Every day | ORAL | Status: DC | PRN

## 2016-02-26 NOTE — Progress Notes (Signed)
Subjective:    Patient ID: Peggy Russo, female    DOB: [birth year], 60 y.o.   MRN: [id number]  HPI: [name] is a 60 y.o. female presenting on 02/26/2016 for Follow-up   HPI  Pt presents for recheck of overactive bladder symptoms. Pt reporting only voiding once per day. Symptoms present since 2009. Drinking plenty of fluids- drinks 4 bottles of water per day. Voiding still >10 times per night. Detrol has not helped her symptoms.  Doesn't feel the urge to go during the day. Urine culture was negative for infection at 2/8 visit with GYN. Doesn't hurt to void. No hematuria. No low back pain. Also having constipation- not taking anything. Was previously on miralax. Stool softener didn't help. BM's occur once per week. Strains to get them out. Has been impacted in the past.   Epigastric pain and L sided stabbing chest pain. Symptoms present x 1 week. Occur 3 times per day. No pattern with meals. Doesn't eat much. Stabbing pain is always on the L side. Pain goes away on own. Occurs at rest and with activity. Pt has known PVD.  Past Medical History  Diagnosis Date  . Hypertension   . Anxiety   . Depression   . GERD (gastroesophageal reflux disease)   . High cholesterol   . Abnormal Pap smear of cervix 07/2015    needed Leep- did not get one    Current Outpatient Prescriptions on File Prior to Visit  Medication Sig  . amLODipine (NORVASC) 10 MG tablet Take 1 tablet (10 mg total) by mouth daily.  Marland Kitchen aspirin EC 81 MG tablet Take by mouth.  . clopidogrel (PLAVIX) 75 MG tablet Take 1 tablet (75 mg total) by mouth once.  . tolterodine (DETROL LA) 2 MG 24 hr capsule Take 1 capsule (2 mg total) by mouth daily.  . mirtazapine (REMERON) 15 MG tablet Take 1 tablet (15 mg total) by mouth at bedtime. (Patient not taking: Reported on 02/26/2016)  . pantoprazole (PROTONIX) 20 MG tablet Take 1 tablet (20 mg total) by mouth daily. (Patient not taking: Reported on 02/26/2016)  . sertraline (ZOLOFT) 50 MG tablet  Reported on 02/26/2016   No current facility-administered medications on file prior to visit.    Review of Systems  Constitutional: Negative for fever and chills.  HENT: Negative.   Respiratory: Negative for cough, chest tightness and wheezing.   Cardiovascular: Positive for chest pain. Negative for palpitations and leg swelling.  Gastrointestinal: Positive for abdominal pain, constipation and abdominal distention. Negative for nausea, vomiting and diarrhea.  Endocrine: Negative.  Negative for cold intolerance, heat intolerance, polydipsia, polyphagia and polyuria.  Genitourinary: Positive for frequency (nocturia) and difficulty urinating. Negative for dysuria.  Musculoskeletal: Negative.  Negative for myalgias and back pain.  Neurological: Negative for dizziness, light-headedness and numbness.  Psychiatric/Behavioral: Negative.    Per HPI unless specifically indicated above     Objective:    BP 137/78 mmHg  Pulse 89  Temp(Src) 98.7 F (37.1 C) (Oral)  Resp 16  Wt 169 lb 6.4 oz (76.839 kg)  SpO2 99%  Wt Readings from Last 3 Encounters:  02/26/16 169 lb 6.4 oz (76.839 kg)  01/22/16 171 lb (77.565 kg)  01/16/16 166 lb 9.6 oz (75.569 kg)    Physical Exam  Constitutional: She is oriented to person, place, and time. She appears well-developed and well-nourished.  HENT:  Head: Normocephalic and atraumatic.  Neck: Neck supple.  Cardiovascular: Normal rate, regular rhythm and normal heart sounds.  Exam  reveals no gallop and no friction rub.   No murmur heard. Pulmonary/Chest: Effort normal and breath sounds normal. She has no wheezes. She exhibits no tenderness.  Abdominal: Soft. Normal appearance and bowel sounds are normal. She exhibits no distension and no mass. There is no splenomegaly or hepatomegaly. There is tenderness in the right lower quadrant and left lower quadrant. There is no rigidity, no rebound, no guarding and no CVA tenderness.  Musculoskeletal: Normal range of  motion. She exhibits no edema or tenderness.  Lymphadenopathy:    She has no cervical adenopathy.  Neurological: She is alert and oriented to person, place, and time.  Skin: Skin is warm and dry.   Results for orders placed or performed in visit on [id number]  Basic metabolic panel  Result Value Ref Range   Glucose 97 mg/dL   Creatinine 1.0 .5 - 1.1 mg/dL  Lipid panel  Result Value Ref Range   Triglycerides 203 (A) 40 - 160 mg/dL   Cholesterol 295 (A) 0 - 200 mg/dL   HDL 74 (A) 35 - 70 mg/dL   LDL Cholesterol 180 mg/dL  Hepatic function panel  Result Value Ref Range   ALT 27 7 - 35 U/L   AST 15 13 - 35 U/L      Assessment & Plan:   Problem List Items Addressed This Visit      Cardiovascular and Mediastinum   Arteriosclerosis of artery of extremity ([place])    Change to high intensity statin due to PVD and elevated LDL. Managed by vein and vascular. Continue plavix.       Relevant Medications   atorvastatin (LIPITOR) 40 MG tablet   Other Relevant Orders   Ambulatory referral to Cardiology     Digestive   Chronic constipation    KUB to determine stool burden given distension and poor appetite. Start amitiza to help with symptoms. Encouraged use of benefiber PRN to help increase regularity. Recheck 1 mos.       Relevant Medications   lubiprostone (AMITIZA) 8 MCG capsule   Other Relevant Orders   DG Abd 1 View     Other   Nocturia    Likely 2/2 not voiding during the day. Trial of bladder training- void every 2-3 hours during the day to reduce nighttime symptoms. Limit fluids prior to bed. Continue detrol. Consider urology if symptoms not improved. Recheck 1 mos.        Other Visit Diagnoses    Atypical chest pain    -  Primary    ECG WNL. However given chest pain at rest and known ASCVD r/o cardiac causes of chest pain. Likely GERD vs. MSK. Alarm symptoms reviewed.     Relevant Orders    EKG 12-Lead    Ambulatory referral to Cardiology       Meds ordered this  encounter  Medications  . atorvastatin (LIPITOR) 40 MG tablet    Sig: Take 1 tablet (40 mg total) by mouth daily.    Dispense:  90 tablet    Refill:  3    Order Specific Question:  Supervising Provider    Answer:  Arlis Porta [phone]  . lubiprostone (AMITIZA) 8 MCG capsule    Sig: Take 1 capsule (8 mcg total) by mouth 2 (two) times daily with a meal.    Dispense:  60 capsule    Refill:  11    Order Specific Question:  Supervising Provider    Answer:  Arlis Porta [phone]  Follow up plan: Return in about 4 weeks (around 03/25/2016), or if symptoms worsen or fail to improve.

## 2016-02-26 NOTE — Patient Instructions (Signed)
Bladder: I think you are voiding at night because you don't go to the bathroom during the day. Try going to the bathroom every 2-3 hours during the day to "train" your bladder to void during the day. If this doesn't help, we will have you see a urologist.   Chest pain: I am not sure your chest pain is cardiac related, however because of the blockages in your legs, you are high risk for heart disease. I think it would be worth getting checked by a cardiologist to ensure the pain is not related to your heart.   Constipation: We will do a belly XR today to determine if your distension and poor appetite are coming from lots of stool in the belly. I will give you some medications to help with constipation.

## 2016-02-26 NOTE — Assessment & Plan Note (Signed)
Change to high intensity statin due to PVD and elevated LDL. Managed by vein and vascular. Continue plavix.

## 2016-02-26 NOTE — Assessment & Plan Note (Signed)
Likely 2/2 not voiding during the day. Trial of bladder training- void every 2-3 hours during the day to reduce nighttime symptoms. Limit fluids prior to bed. Continue detrol. Consider urology if symptoms not improved. Recheck 1 mos.

## 2016-02-26 NOTE — Assessment & Plan Note (Signed)
KUB to determine stool burden given distension and poor appetite. Start amitiza to help with symptoms. Encouraged use of benefiber PRN to help increase regularity. Recheck 1 mos.

## 2016-02-28 ENCOUNTER — Ambulatory Visit (INDEPENDENT_AMBULATORY_CARE_PROVIDER_SITE_OTHER): Payer: Medicare Other | Admitting: Obstetrics and Gynecology

## 2016-02-28 ENCOUNTER — Encounter: Payer: Self-pay | Admitting: Obstetrics and Gynecology

## 2016-02-28 VITALS — BP 136/80 | HR 109 | Temp 99.5°F | Ht 65.5 in | Wt 166.6 lb

## 2016-02-28 DIAGNOSIS — R351 Nocturia: Secondary | ICD-10-CM

## 2016-02-28 DIAGNOSIS — J111 Influenza due to unidentified influenza virus with other respiratory manifestations: Secondary | ICD-10-CM | POA: Diagnosis not present

## 2016-02-28 DIAGNOSIS — N3289 Other specified disorders of bladder: Secondary | ICD-10-CM | POA: Diagnosis not present

## 2016-02-28 DIAGNOSIS — I70209 Unspecified atherosclerosis of native arteries of extremities, unspecified extremity: Secondary | ICD-10-CM

## 2016-02-28 NOTE — Patient Instructions (Signed)
1.Rapid flu Test is done today 2. Increase Detrol LA  To 4 mg daily at bedtime 3. Return in 6 weeks for follow-up

## 2016-02-28 NOTE — Progress Notes (Signed)
GYN ENCOUNTER NOTE  Subjective:       Peggy Russo is a 60 y.o. [id number] female is here for gynecologic evaluation of the following issues:  1.  Nocturia 2.  Bacterial Vaginosis    Patient presents with follow-up of nocturia (10x / night) and bacterial vaginosis.  The BV resolved after 2 weeks of Flagyl treatment, the patient denies symptoms of itching, irritation, pain, and discharge.  On 01/21/16 she was started on oxybutynin 5mg  BID PO for overactive bladder symptoms, but on 01/22/16 her PCP prescribed tolterodine (Detrol) 2mg  daily In the mornings.  Patient states nocturia is unchanged from last visit despite daily intake of Detrol as prescribed.  Gynecologic History No LMP recorded. Patient is postmenopausal. Contraception: post menopausal status Last Pap: 2016. Results were: abnormal Last mammogram: 2016. Results were: normal  Obstetric History OB History  Gravida Para Term Preterm AB SAB TAB Ectopic Multiple Living  5 4 4       5     # Outcome Date GA Lbr Len/2nd Weight Sex Delivery Anes PTL Lv  5 Term 1985   6 lb (2.722 kg) M CS-LTranv   Y  4 Term 1982   6 lb (2.722 kg) M Vag-Spont   Y  3 Term 1981   7 lb (3.175 kg) M Vag-Spont   Y  2 Gravida 1977   6 lb (2.722 kg) F Vag-Spont   Y  1 Term 1973   6 lb 2.1 oz (2.781 kg) M Vag-Spont   Y      Past Medical History  Diagnosis Date  . Hypertension   . Anxiety   . Depression   . GERD (gastroesophageal reflux disease)   . High cholesterol   . Abnormal Pap smear of cervix 07/2015    needed Leep- did not get one    Past Surgical History  Procedure Laterality Date  . Stent leg    . Splenectomy, total    . Cesarean section    . Tubal ligation      Current Outpatient Prescriptions on File Prior to Visit  Medication Sig Dispense Refill  . amLODipine (NORVASC) 10 MG tablet Take 1 tablet (10 mg total) by mouth daily. 90 tablet 3  . aspirin EC 81 MG tablet Take by mouth.    Marland Kitchen atorvastatin (LIPITOR) 40 MG tablet Take 1 tablet (40  mg total) by mouth daily. 90 tablet 3  . clopidogrel (PLAVIX) 75 MG tablet Take 1 tablet (75 mg total) by mouth once. 90 tablet 3  . lubiprostone (AMITIZA) 8 MCG capsule Take 1 capsule (8 mcg total) by mouth 2 (two) times daily with a meal. 60 capsule 11  . tolterodine (DETROL LA) 2 MG 24 hr capsule Take 1 capsule (2 mg total) by mouth daily. 30 capsule 11   No current facility-administered medications on file prior to visit.    Allergies  Allergen Reactions  . Acetaminophen Itching    Patient states that she tolerates Percocet (oxycodone/APAP).  . Hydromorphone Other (See Comments)    Social History   Social History  . Marital Status: Legally Separated    Spouse Name: N/A  . Number of Children: N/A  . Years of Education: N/A   Occupational History  . Not on file.   Social History Main Topics  . Smoking status: Current Every Day Smoker -- 0.50 packs/day for 25 years    Types: Cigarettes  . Smokeless tobacco: Not on file  . Alcohol Use: No  . Drug Use: Yes  Special: Marijuana     Comment: daily  . Sexual Activity: No   Other Topics Concern  . Not on file   Social History Narrative    Family History  Problem Relation Age of Onset  . Hypertension Mother   . Cancer Neg Hx   . Diabetes Neg Hx   . Ovarian cancer Neg Hx   . Heart disease Neg Hx     The following portions of the patient's history were reviewed and updated as appropriate: allergies, current medications, past family history, past medical history, past social history, past surgical history and problem list.  Review of Systems Review of Systems - General ROS: negative for - chills, fatigue, fever, hot flashes, malaise or night sweats Hematological and Lymphatic ROS: negative for - bleeding problems or swollen lymph nodes Gastrointestinal ROS: negative for - abdominal pain, blood in stools, change in bowel habits and nausea/vomiting Musculoskeletal ROS: negative for - joint pain, muscle pain or muscular  weakness Genito-Urinary ROS: negative for - change in menstrual cycle, dysmenorrhea, dyspareunia, dysuria, genital discharge, genital ulcers, hematuria, irregular/heavy menses, nocturia or pelvic pain.  Positive for nocturia / overactive bladder.  Objective:   BP 136/80 mmHg  Pulse 109  Temp(Src) 99.5 F (37.5 C)  Ht 5' 5.5" (1.664 m)  Wt 166 lb 9.6 oz (75.569 kg)  BMI 27.29 kg/m2  Physical exam deferred.  PROCEDURE: Rapid flu test  Assessment:   1.  Nocturia 2.  Bacterial Vaginosis: resolved 3.  Influenza symptoms      - Negative rapid flu test   Plan:   1.  Switch Detrol dosage from 2mg  to 4mg  daily, taken at night 2.  Recommend good hydration, electrolyte replacement, and extra rest for flu symptoms 3.  Return in 6 weeks for follow-up.  A total of 15 minutes were spent face-to-face with the patient during this encounter and over half of that time dealt with counseling and coordination of care.  Olene Floss, PA-S Brayton Mars, MD   I have seen, interviewed, and examined the patient in conjunction with the [facility].A. student and affirm the diagnosis and management plan. Chris Narasimhan A. Heavenlee Maiorana, MD, FACOG   Note: This dictation was prepared with Dragon dictation along with smaller phrase technology. Any transcriptional errors that result from this process are unintentional.

## 2016-02-29 LAB — POCT INFLUENZA A/B
INFLUENZA A, POC: NEGATIVE
Influenza B, POC: NEGATIVE

## 2016-03-03 ENCOUNTER — Other Ambulatory Visit: Payer: Self-pay | Admitting: Family Medicine

## 2016-03-03 DIAGNOSIS — K219 Gastro-esophageal reflux disease without esophagitis: Secondary | ICD-10-CM

## 2016-03-03 MED ORDER — PANTOPRAZOLE SODIUM 20 MG PO TBEC: 20.0000 mg | DELAYED_RELEASE_TABLET | Freq: Every day | ORAL | Status: DC

## 2016-03-03 NOTE — Telephone Encounter (Signed)
Pt is aware reg her refill.

## 2016-03-03 NOTE — Telephone Encounter (Signed)
I don't have her listed as being on pantoprazole. It may be an older prescription from her previous PCP. Per care everywhere, it looks like she was on pepcid. Can someone call and confirm her dose? Thanks! AK

## 2016-03-03 NOTE — Telephone Encounter (Signed)
Pt. States that  meds Pantoprazole  Was 20 mg and was given to her by another PCP she states that she need something for her  Acid-reflux

## 2016-03-03 NOTE — Telephone Encounter (Signed)
Pt. Called requesting a refill on  Pantoprazole pt call back # is  [phone]

## 2016-03-03 NOTE — Telephone Encounter (Signed)
LMTCB

## 2016-03-03 NOTE — Telephone Encounter (Signed)
Okay. I will send in the 20mg  dose to her pharmacy on file. Thanks! AK

## 2016-03-12 ENCOUNTER — Encounter: Payer: Self-pay | Admitting: *Deleted

## 2016-03-12 ENCOUNTER — Ambulatory Visit: Payer: Medicare Other | Admitting: Cardiology

## 2016-03-13 ENCOUNTER — Encounter: Payer: Medicare Other | Admitting: Obstetrics and Gynecology

## 2016-03-17 ENCOUNTER — Telehealth: Payer: Self-pay | Admitting: Family Medicine

## 2016-03-17 ENCOUNTER — Other Ambulatory Visit: Payer: Self-pay | Admitting: Family Medicine

## 2016-03-17 MED ORDER — OMEPRAZOLE 20 MG PO CPDR: 20.0000 mg | DELAYED_RELEASE_CAPSULE | Freq: Every day | ORAL | Status: DC

## 2016-03-17 NOTE — Telephone Encounter (Signed)
Pt needs refill on prilosec and estrogen sent to [facility] in [facility].  She said the protonix is not working for her now. Her call back number is [phone]

## 2016-03-17 NOTE — Telephone Encounter (Signed)
Patient aware Prilosec sent. She was made aware she needs to call [facility] for Estrogen refill. This was something she used to get from old GYN.

## 2016-03-17 NOTE — Telephone Encounter (Signed)
I do not prescribe estrogen. She might be getting this from [facility]. Please call them for refill. I will send in the omeprazole to pharmacy. Thanks! AK

## 2016-03-25 ENCOUNTER — Ambulatory Visit: Payer: Medicare Other | Admitting: Family Medicine

## 2016-03-25 ENCOUNTER — Encounter: Payer: Self-pay | Admitting: Family Medicine

## 2016-04-01 DIAGNOSIS — I10 Essential (primary) hypertension: Secondary | ICD-10-CM | POA: Diagnosis not present

## 2016-04-01 DIAGNOSIS — K5900 Constipation, unspecified: Secondary | ICD-10-CM | POA: Diagnosis not present

## 2016-04-01 DIAGNOSIS — R350 Frequency of micturition: Secondary | ICD-10-CM | POA: Diagnosis not present

## 2016-04-02 ENCOUNTER — Encounter: Payer: Medicare Other | Admitting: Obstetrics and Gynecology

## 2016-04-07 ENCOUNTER — Telehealth: Payer: Self-pay | Admitting: Family Medicine

## 2016-04-07 DIAGNOSIS — N3281 Overactive bladder: Secondary | ICD-10-CM

## 2016-04-07 MED ORDER — TOLTERODINE TARTRATE ER 2 MG PO CP24: 4.0000 mg | ORAL_CAPSULE | Freq: Every day | ORAL | Status: DC

## 2016-04-07 NOTE — Telephone Encounter (Signed)
Patient has f/u on 04/09/16 with Dr. Keturah Barre.

## 2016-04-07 NOTE — Telephone Encounter (Signed)
I did not change that dose. It looks like GYN did. I will update the prescription but make sure she follows-up with Dr. Keturah Barre.  Thanks! AK

## 2016-04-07 NOTE — Telephone Encounter (Signed)
Amy changed the pt's dosage on tolterodine to 2 at night.  The prescription was written for 1 daily so she ran out of medication.  Please send a new prescription for 2 tablets so insurance will pay.  Her call back number is [phone]

## 2016-04-09 DIAGNOSIS — Z206 Contact with and (suspected) exposure to human immunodeficiency virus [HIV]: Secondary | ICD-10-CM | POA: Diagnosis not present

## 2016-04-30 DIAGNOSIS — I10 Essential (primary) hypertension: Secondary | ICD-10-CM | POA: Diagnosis not present

## 2016-05-12 DIAGNOSIS — I10 Essential (primary) hypertension: Secondary | ICD-10-CM | POA: Diagnosis not present

## 2016-05-12 DIAGNOSIS — Z79899 Other long term (current) drug therapy: Secondary | ICD-10-CM | POA: Diagnosis not present

## 2016-05-12 DIAGNOSIS — G8929 Other chronic pain: Secondary | ICD-10-CM | POA: Diagnosis not present

## 2016-05-12 DIAGNOSIS — F17210 Nicotine dependence, cigarettes, uncomplicated: Secondary | ICD-10-CM | POA: Diagnosis not present

## 2016-05-12 DIAGNOSIS — M545 Low back pain: Secondary | ICD-10-CM | POA: Diagnosis not present

## 2016-05-12 DIAGNOSIS — M62838 Other muscle spasm: Secondary | ICD-10-CM | POA: Diagnosis not present

## 2016-05-13 DIAGNOSIS — M9971 Connective tissue and disc stenosis of intervertebral foramina of cervical region: Secondary | ICD-10-CM | POA: Diagnosis not present

## 2016-05-13 DIAGNOSIS — M542 Cervicalgia: Secondary | ICD-10-CM | POA: Diagnosis not present

## 2016-05-13 DIAGNOSIS — S0990XA Unspecified injury of head, initial encounter: Secondary | ICD-10-CM | POA: Diagnosis not present

## 2016-05-13 DIAGNOSIS — S161XXA Strain of muscle, fascia and tendon at neck level, initial encounter: Secondary | ICD-10-CM | POA: Diagnosis not present

## 2016-05-13 DIAGNOSIS — M47812 Spondylosis without myelopathy or radiculopathy, cervical region: Secondary | ICD-10-CM | POA: Diagnosis not present

## 2016-05-13 DIAGNOSIS — F17210 Nicotine dependence, cigarettes, uncomplicated: Secondary | ICD-10-CM | POA: Diagnosis not present

## 2016-05-13 DIAGNOSIS — R51 Headache: Secondary | ICD-10-CM | POA: Diagnosis not present

## 2016-05-13 DIAGNOSIS — I10 Essential (primary) hypertension: Secondary | ICD-10-CM | POA: Diagnosis not present

## 2016-05-13 DIAGNOSIS — S199XXA Unspecified injury of neck, initial encounter: Secondary | ICD-10-CM | POA: Diagnosis not present

## 2016-05-29 DIAGNOSIS — C189 Malignant neoplasm of colon, unspecified: Secondary | ICD-10-CM | POA: Diagnosis not present

## 2016-06-12 DIAGNOSIS — Z955 Presence of coronary angioplasty implant and graft: Secondary | ICD-10-CM | POA: Diagnosis not present

## 2016-06-12 DIAGNOSIS — F17210 Nicotine dependence, cigarettes, uncomplicated: Secondary | ICD-10-CM | POA: Diagnosis not present

## 2016-06-12 DIAGNOSIS — K5900 Constipation, unspecified: Secondary | ICD-10-CM | POA: Diagnosis not present

## 2016-06-12 DIAGNOSIS — I10 Essential (primary) hypertension: Secondary | ICD-10-CM | POA: Diagnosis not present

## 2016-06-12 DIAGNOSIS — K635 Polyp of colon: Secondary | ICD-10-CM | POA: Diagnosis not present

## 2016-06-12 DIAGNOSIS — R634 Abnormal weight loss: Secondary | ICD-10-CM | POA: Diagnosis not present

## 2016-06-12 DIAGNOSIS — D127 Benign neoplasm of rectosigmoid junction: Secondary | ICD-10-CM | POA: Diagnosis not present

## 2016-06-12 DIAGNOSIS — Z7982 Long term (current) use of aspirin: Secondary | ICD-10-CM | POA: Diagnosis not present

## 2016-06-12 DIAGNOSIS — K648 Other hemorrhoids: Secondary | ICD-10-CM | POA: Diagnosis not present

## 2016-06-12 DIAGNOSIS — E7800 Pure hypercholesterolemia, unspecified: Secondary | ICD-10-CM | POA: Diagnosis not present

## 2016-06-12 DIAGNOSIS — K5909 Other constipation: Secondary | ICD-10-CM | POA: Diagnosis not present

## 2016-06-12 DIAGNOSIS — K219 Gastro-esophageal reflux disease without esophagitis: Secondary | ICD-10-CM | POA: Diagnosis not present

## 2016-06-12 DIAGNOSIS — Z9089 Acquired absence of other organs: Secondary | ICD-10-CM | POA: Diagnosis not present

## 2016-06-12 DIAGNOSIS — Z79899 Other long term (current) drug therapy: Secondary | ICD-10-CM | POA: Diagnosis not present

## 2016-06-12 DIAGNOSIS — Z9889 Other specified postprocedural states: Secondary | ICD-10-CM | POA: Diagnosis not present

## 2016-06-12 DIAGNOSIS — I2510 Atherosclerotic heart disease of native coronary artery without angina pectoris: Secondary | ICD-10-CM | POA: Diagnosis not present

## 2016-06-12 DIAGNOSIS — Z1211 Encounter for screening for malignant neoplasm of colon: Secondary | ICD-10-CM | POA: Diagnosis not present

## 2016-06-17 IMAGING — DX DG ABDOMEN 1V
1 series · 1 of 1 positions shown · non-contrast
Comparison: None.

CLINICAL DATA: Chronic constipation, abdominal pain

EXAM:
ABDOMEN - 1 VIEW

[abdomen kub]
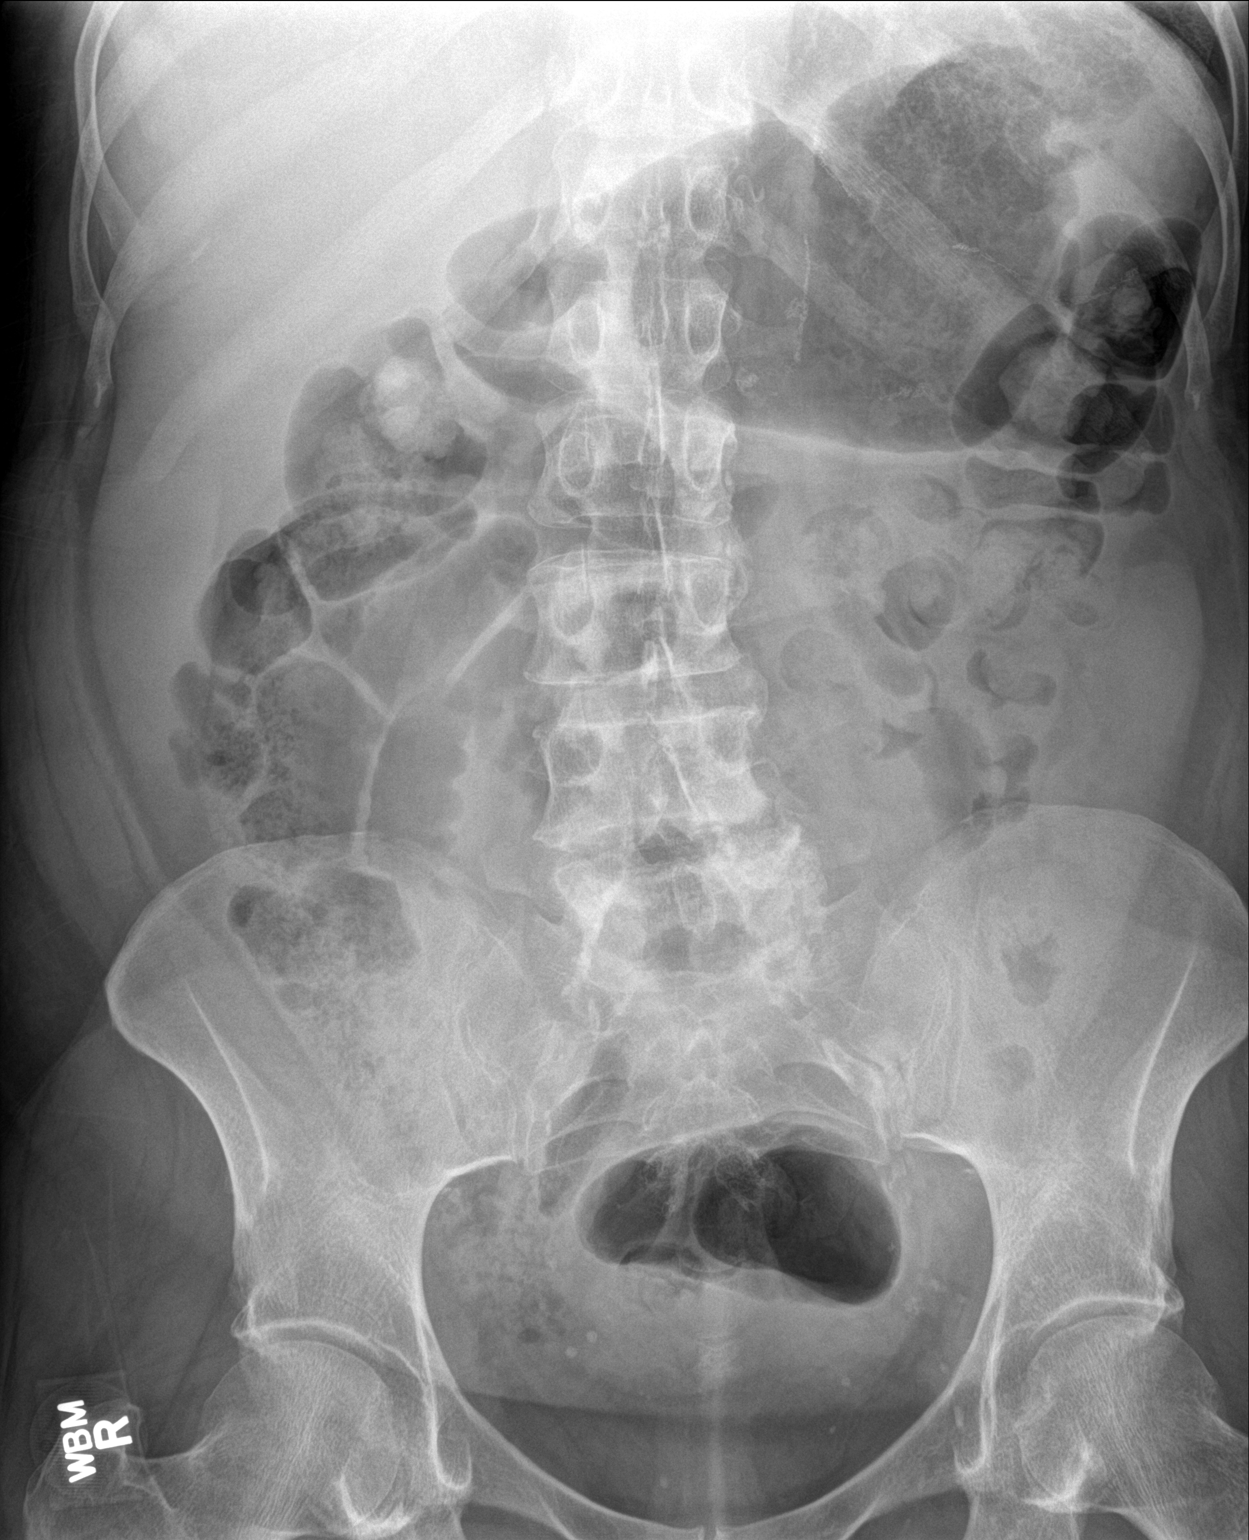

[1 of 1 positions shown; findings below may reference images not displayed]

FINDINGS: No bowel obstruction is seen. There is a moderate amount of feces
throughout the colon. A more dense opacity overlying the right
abdomen may represent material within bowel. There is a slight
lumbar curvature convex to the right with degenerative change at
L4-5 and L5-S1.
IMPRESSION: No bowel obstruction.  No definite opaque calculi.

## 2016-07-31 DIAGNOSIS — L309 Dermatitis, unspecified: Secondary | ICD-10-CM | POA: Diagnosis not present

## 2016-07-31 DIAGNOSIS — E785 Hyperlipidemia, unspecified: Secondary | ICD-10-CM | POA: Diagnosis not present

## 2016-07-31 DIAGNOSIS — I10 Essential (primary) hypertension: Secondary | ICD-10-CM | POA: Diagnosis not present

## 2016-08-01 DIAGNOSIS — I10 Essential (primary) hypertension: Secondary | ICD-10-CM | POA: Diagnosis not present

## 2016-08-04 DIAGNOSIS — N898 Other specified noninflammatory disorders of vagina: Secondary | ICD-10-CM | POA: Diagnosis not present

## 2016-08-05 DIAGNOSIS — N898 Other specified noninflammatory disorders of vagina: Secondary | ICD-10-CM | POA: Diagnosis not present

## 2016-08-07 DIAGNOSIS — Z206 Contact with and (suspected) exposure to human immunodeficiency virus [HIV]: Secondary | ICD-10-CM | POA: Diagnosis not present

## 2016-09-18 DIAGNOSIS — Z206 Contact with and (suspected) exposure to human immunodeficiency virus [HIV]: Secondary | ICD-10-CM | POA: Diagnosis not present

## 2016-10-07 ENCOUNTER — Emergency Department
Admission: EM | Admit: 2016-10-07 | Discharge: 2016-10-07 | Disposition: A | Discharge status: Home / Self Care | Payer: Medicare Other | Attending: Emergency Medicine | Admitting: Emergency Medicine

## 2016-10-07 ENCOUNTER — Encounter: Payer: Self-pay | Admitting: Emergency Medicine

## 2016-10-07 DIAGNOSIS — Z7982 Long term (current) use of aspirin: Secondary | ICD-10-CM | POA: Diagnosis not present

## 2016-10-07 DIAGNOSIS — F418 Other specified anxiety disorders: Secondary | ICD-10-CM | POA: Diagnosis not present

## 2016-10-07 DIAGNOSIS — M62830 Muscle spasm of back: Secondary | ICD-10-CM | POA: Diagnosis not present

## 2016-10-07 DIAGNOSIS — Y939 Activity, unspecified: Secondary | ICD-10-CM | POA: Insufficient documentation

## 2016-10-07 DIAGNOSIS — Y999 Unspecified external cause status: Secondary | ICD-10-CM | POA: Insufficient documentation

## 2016-10-07 DIAGNOSIS — Y929 Unspecified place or not applicable: Secondary | ICD-10-CM | POA: Insufficient documentation

## 2016-10-07 DIAGNOSIS — F17210 Nicotine dependence, cigarettes, uncomplicated: Secondary | ICD-10-CM | POA: Insufficient documentation

## 2016-10-07 DIAGNOSIS — Z79899 Other long term (current) drug therapy: Secondary | ICD-10-CM | POA: Insufficient documentation

## 2016-10-07 DIAGNOSIS — S39012A Strain of muscle, fascia and tendon of lower back, initial encounter: Secondary | ICD-10-CM | POA: Insufficient documentation

## 2016-10-07 DIAGNOSIS — T148XXA Other injury of unspecified body region, initial encounter: Secondary | ICD-10-CM

## 2016-10-07 DIAGNOSIS — I10 Essential (primary) hypertension: Secondary | ICD-10-CM | POA: Insufficient documentation

## 2016-10-07 DIAGNOSIS — S3992XA Unspecified injury of lower back, initial encounter: Secondary | ICD-10-CM | POA: Diagnosis present

## 2016-10-07 DIAGNOSIS — X58XXXA Exposure to other specified factors, initial encounter: Secondary | ICD-10-CM | POA: Diagnosis not present

## 2016-10-07 LAB — URINALYSIS COMPLETE WITH MICROSCOPIC (ARMC ONLY)
BILIRUBIN URINE: NEGATIVE
GLUCOSE, UA: NEGATIVE mg/dL
NITRITE: NEGATIVE
Protein, ur: 30 mg/dL — AB
SPECIFIC GRAVITY, URINE: 1.024 (ref 1.005–1.030)
pH: 5 (ref 5.0–8.0)

## 2016-10-07 MED ORDER — CYCLOBENZAPRINE HCL 10 MG PO TABS
10.0000 mg | ORAL_TABLET | Freq: Once | ORAL | Status: AC
  Administered 2016-10-07: 10 mg via ORAL
  Filled 2016-10-07: qty 1

## 2016-10-07 MED ORDER — OXYCODONE HCL 5 MG PO TABS: 5.0000 mg | ORAL_TABLET | ORAL | 0 refills | Status: DC | PRN

## 2016-10-07 MED ORDER — CYCLOBENZAPRINE HCL 10 MG PO TABS
10.0000 mg | ORAL_TABLET | Freq: Three times a day (TID) | ORAL | 0 refills | Status: DC | PRN
Start: 2016-10-07 — End: 2016-12-10

## 2016-10-07 MED ORDER — OXYCODONE HCL 5 MG PO TABS
5.0000 mg | ORAL_TABLET | Freq: Once | ORAL | Status: AC
  Administered 2016-10-07: 5 mg via ORAL
  Filled 2016-10-07: qty 1

## 2016-10-07 NOTE — ED Notes (Signed)
Patient c/o right side back pain. Denies injury. Talking on cell phone during assessment.

## 2016-10-07 NOTE — ED Triage Notes (Signed)
Pt presents to ED with reports of right side back pain. Pt denies dysuria, denies fever, denies nausea, vomiting or diarrhea. Pt denies chest pain. Pt ambulatory to triage, no apparent distress noted.

## 2016-10-07 NOTE — ED Provider Notes (Signed)
[facility] Emergency Department Provider Note   ____________________________________________   First MD Initiated Contact with Patient 10/07/16 1324     (approximate)  I have reviewed the triage vital signs and the nursing notes.   HISTORY  Chief Complaint Back Pain    HPI Peggy Russo is a 60 y.o. female patient complaining of right flank pain for 7-10 days. Patient denies any dysuria, frequency, urgency. Patient denies any vaginal discharge. Patient denies any provocative activity causes her pain. Patient is rating the pain as a 9/10. Patient described a pain as "achy". No palliative measures for her complaint.   Past Medical History:  Diagnosis Date  . Abnormal Pap smear of cervix 07/2015   needed Leep- did not get one  . Anxiety   . Depression   . GERD (gastroesophageal reflux disease)   . High cholesterol   . Hypertension     Patient Active Problem List   Diagnosis Date Noted  . Chronic constipation 02/26/2016  . Vitamin D deficiency 01/22/2016  . Insomnia 01/22/2016  . Arteriosclerosis of artery of extremity ([place]) 01/21/2016  . HIV exposure 01/21/2016  . Nocturia 01/21/2016  . Combined fat and carbohydrate induced hyperlipemia 06/30/2014  . Extremity pain 09/02/2012  . Clinical depression 08/17/2012  . Acid reflux 08/17/2012  . Abdominal pain, generalized 12/08/1898  . Cyst and pseudocyst of pancreas 12/08/1898  . Benign essential HTN 12/08/1898  . Geniculate herpes zoster 12/08/1898    Past Surgical History:  Procedure Laterality Date  . CESAREAN SECTION    . SPLENECTOMY, TOTAL    . stent leg    . TUBAL LIGATION      Prior to Admission medications   Medication Sig Start Date End Date Taking? Authorizing Provider  amLODipine (NORVASC) 10 MG tablet Take 1 tablet (10 mg total) by mouth daily. 01/22/16   Amy Overton Mam, NP  aspirin EC 81 MG tablet Take by mouth.    Historical Provider, MD  atorvastatin (LIPITOR) 40 MG tablet  Take 1 tablet (40 mg total) by mouth daily. 02/26/16   Amy Overton Mam, NP  clopidogrel (PLAVIX) 75 MG tablet Take 1 tablet (75 mg total) by mouth once. 01/22/16   Amy Overton Mam, NP  cyclobenzaprine (FLEXERIL) 10 MG tablet Take 1 tablet (10 mg total) by mouth 3 (three) times daily as needed. 10/07/16   Sable Feil, PA-C  gabapentin (NEURONTIN) 100 MG capsule  01/25/16   Historical Provider, MD  lubiprostone (AMITIZA) 8 MCG capsule Take 1 capsule (8 mcg total) by mouth 2 (two) times daily with a meal. 02/26/16   Amy Overton Mam, NP  omeprazole (PRILOSEC) 20 MG capsule TAKE 1 CAPSULE(20 MG) BY MOUTH DAILY 03/17/16   Amy Overton Mam, NP  oxyCODONE (ROXICODONE) 5 MG immediate release tablet Take 1 tablet (5 mg total) by mouth every 4 (four) hours as needed for severe pain. 10/07/16   Sable Feil, PA-C  tolterodine (DETROL LA) 2 MG 24 hr capsule Take 2 capsules (4 mg total) by mouth daily. 04/07/16   Amy Overton Mam, NP    Allergies Acetaminophen and Hydromorphone  Family History  Problem Relation Age of Onset  . Hypertension Mother   . Cancer Neg Hx   . Diabetes Neg Hx   . Ovarian cancer Neg Hx   . Heart disease Neg Hx     Social History Social History  Substance Use Topics  . Smoking status: Current Every Day Smoker    Packs/day: 0.50  Years: 25.00    Types: Cigarettes  . Smokeless tobacco: Never Used  . Alcohol use No    Review of Systems Constitutional: No fever/chills Eyes: No visual changes. ENT: No sore throat. Cardiovascular: Denies chest pain. Respiratory: Denies shortness of breath. Gastrointestinal: No abdominal pain.  No nausea, no vomiting.  No diarrhea.  No constipation. Genitourinary: Negative for dysuria. Musculoskeletal: Positive right flank pain  Skin: Negative for rash. Neurological: Negative for headaches, focal weakness or numbness. Psychiatric:Anxiety and depression Endocrine:Hypertension  hyperlipidemia Hematological/Lymphatic:Splenectomy Allergic/Immunilogical: See medication list   ____________________________________________   PHYSICAL EXAM:  VITAL SIGNS: ED Triage Vitals  Enc Vitals Group     BP 10/07/16 1315 (!) 133/91     Pulse Rate 10/07/16 1315 84     Resp 10/07/16 1315 18     Temp 10/07/16 1315 97.7 F (36.5 C)     Temp Source 10/07/16 1315 Oral     SpO2 10/07/16 1315 98 %     Weight 10/07/16 1316 159 lb (72.1 kg)     Height 10/07/16 1316 5' 3.5" (1.613 m)     Head Circumference --      Peak Flow --      Pain Score 10/07/16 1316 9     Pain Loc --      Pain Edu? --      Excl. in [place]? --     Constitutional: Alert and oriented. Well appearing and in no acute distress. Eyes: Conjunctivae are normal. PERRL. EOMI. Head: Atraumatic. Nose: No congestion/rhinnorhea. Mouth/Throat: Mucous membranes are moist.  Oropharynx non-erythematous. Neck: No stridor.  No cervical spine tenderness to palpation. Hematological/Lymphatic/Immunilogical: No cervical lymphadenopathy. Cardiovascular: Normal rate, regular rhythm. Grossly normal heart sounds.  Good peripheral circulation. Respiratory: Normal respiratory effort.  No retractions. Lungs CTAB. Gastrointestinal: Soft and nontender. No distention. No abdominal bruits. No CVA tenderness. Musculoskeletal: No spinal deformity. Patient nontender palpation spinal processes. Patient has a marked guarding with palpation the right flank. Patient has decreased range of motion with lateral movements. No lower extremity tenderness nor edema.  No joint effusions. Patient has negative straight leg test. Neurologic:  Normal speech and language. No gross focal neurologic deficits are appreciated. No gait instability. Skin:  Skin is warm, dry and intact. No rash noted. Psychiatric: Mood and affect are normal. Speech and behavior are normal.  ____________________________________________   LABS (all labs ordered are listed, but only  abnormal results are displayed)  Labs Reviewed  URINALYSIS COMPLETEWITH MICROSCOPIC ([place]) - Abnormal; Notable for the following:       Result Value   Color, Urine YELLOW (*)    APPearance CLOUDY (*)    Ketones, ur TRACE (*)    Hgb urine dipstick 1+ (*)    Protein, ur 30 (*)    Leukocytes, UA 2+ (*)    Bacteria, UA RARE (*)    Squamous Epithelial / LPF TOO NUMEROUS TO COUNT (*)    All other components within normal limits   ____________________________________________  EKG   ____________________________________________  RADIOLOGY   ____________________________________________   PROCEDURES  Procedure(s) performed: None  Procedures  Critical Care performed: No  ____________________________________________   INITIAL IMPRESSION / ASSESSMENT AND PLAN / ED COURSE  Pertinent labs & imaging results that were available during my care of the patient were reviewed by me and considered in my medical decision making (see chart for details). Right paraspinal muscle strain. Patient given discharge care instructions. Patient a prescription for Flexeril and oxycodone. Patient advised follow-up family doctor condition  persists.  Clinical Course     ____________________________________________   FINAL CLINICAL IMPRESSION(S) / ED DIAGNOSES  Final diagnoses:  Muscle strain  Muscle spasm of back      NEW MEDICATIONS STARTED DURING THIS VISIT:  New Prescriptions   CYCLOBENZAPRINE (FLEXERIL) 10 MG TABLET    Take 1 tablet (10 mg total) by mouth 3 (three) times daily as needed.   OXYCODONE (ROXICODONE) 5 MG IMMEDIATE RELEASE TABLET    Take 1 tablet (5 mg total) by mouth every 4 (four) hours as needed for severe pain.     Note:  This document was prepared using Dragon voice recognition software and may include unintentional dictation errors.    Sable Feil, PA-C 10/07/16 [place], MD 10/09/16 [phone]

## 2016-10-07 NOTE — ED Notes (Signed)
Patient instructed she is not to drive. Patients daughter on her way to come and pick her up.

## 2016-11-06 ENCOUNTER — Telehealth: Payer: Self-pay | Admitting: Obstetrics and Gynecology

## 2016-11-06 NOTE — Telephone Encounter (Signed)
Peggy Russo this pt called and said she wanted an AE. After hanging up I looked at her visits and wondered if that would be the wrong kind of visit. Will you look at this and determine what she needs for a visit.

## 2016-12-10 ENCOUNTER — Ambulatory Visit (INDEPENDENT_AMBULATORY_CARE_PROVIDER_SITE_OTHER): Payer: Medicare Other | Admitting: Obstetrics and Gynecology

## 2016-12-10 VITALS — BP 127/74 | HR 77 | Ht 63.5 in | Wt 165.4 lb

## 2016-12-10 DIAGNOSIS — N3289 Other specified disorders of bladder: Secondary | ICD-10-CM | POA: Diagnosis not present

## 2016-12-10 DIAGNOSIS — Z206 Contact with and (suspected) exposure to human immunodeficiency virus [HIV]: Secondary | ICD-10-CM | POA: Diagnosis not present

## 2016-12-10 DIAGNOSIS — Z1231 Encounter for screening mammogram for malignant neoplasm of breast: Secondary | ICD-10-CM | POA: Diagnosis not present

## 2016-12-10 DIAGNOSIS — Z1239 Encounter for other screening for malignant neoplasm of breast: Secondary | ICD-10-CM

## 2016-12-10 DIAGNOSIS — M545 Low back pain, unspecified: Secondary | ICD-10-CM | POA: Insufficient documentation

## 2016-12-10 DIAGNOSIS — G8929 Other chronic pain: Secondary | ICD-10-CM | POA: Diagnosis not present

## 2016-12-10 DIAGNOSIS — R351 Nocturia: Secondary | ICD-10-CM | POA: Diagnosis not present

## 2016-12-10 MED ORDER — TOLTERODINE TARTRATE ER 4 MG PO CP24: 4.0000 mg | ORAL_CAPSULE | Freq: Every day | ORAL | 6 refills | Status: DC

## 2016-12-10 MED ORDER — MIRABEGRON ER 25 MG PO TB24: 25.0000 mg | ORAL_TABLET | Freq: Every day | ORAL | 6 refills | Status: DC

## 2016-12-10 NOTE — Patient Instructions (Signed)
1. Stop Detrol 2. Start Myrbetriq 25 mg a day 3. Return in 6 months for follow-up 4. Referral to [facility] for evaluation and management of low back pain

## 2016-12-10 NOTE — Progress Notes (Signed)
Chief complaint: 1. Unstable bladder 2. Possible HIV exposure  Patient presents for follow-up. She was to return in 5 months ago but did not show until today. She is on Detrol LA 4 mg a day with good control of nocturia-2 times a night. However, she is experiencing extreme mouth dryness. Patient is requesting HIV testing due to her partner having HIV. Patient has chronic low back pain that is not responsive to ibuprofen. She understands this needs to be worked up by her primary care physician  OBJECTIVE: BP 127/74   Pulse 77   Ht 5' 3.5" (1.613 m)   Wt 165 lb 6.4 oz (75 kg)   BMI 28.84 kg/m  Physical exam-deferred  ASSESSMENT: 1. Unstable bladder 2. Dry mouth side effects from Detrol LA 3. History of HSV exposure, desires testing 4. Chronic low back pain  PLAN: 1. Discontinue Detrol LA 2. Trial of Myrbetriq 25 mg daily 3. HIV testing 4. Return in 6 months for follow-up 5. Referral to [facility] for evaluation of low back pain  A total of 15 minutes were spent face-to-face with the patient during this encounter and over half of that time dealt with counseling and coordination of care.  Brayton Mars, MD  Note: This dictation was prepared with Dragon dictation along with smaller phrase technology. Any transcriptional errors that result from this process are unintentional.

## 2017-01-15 DIAGNOSIS — I70213 Atherosclerosis of native arteries of extremities with intermittent claudication, bilateral legs: Secondary | ICD-10-CM | POA: Diagnosis not present

## 2017-01-15 DIAGNOSIS — I779 Disorder of arteries and arterioles, unspecified: Secondary | ICD-10-CM | POA: Diagnosis not present

## 2017-02-02 ENCOUNTER — Encounter: Payer: Self-pay | Admitting: Family Medicine

## 2017-02-02 ENCOUNTER — Ambulatory Visit (INDEPENDENT_AMBULATORY_CARE_PROVIDER_SITE_OTHER): Payer: Medicare Other | Admitting: Family Medicine

## 2017-02-02 VITALS — BP 119/78 | HR 70 | Temp 98.6°F | Resp 16 | Ht 63.5 in | Wt 169.0 lb

## 2017-02-02 DIAGNOSIS — R7303 Prediabetes: Secondary | ICD-10-CM | POA: Diagnosis not present

## 2017-02-02 DIAGNOSIS — M5441 Lumbago with sciatica, right side: Secondary | ICD-10-CM | POA: Diagnosis not present

## 2017-02-02 DIAGNOSIS — N3289 Other specified disorders of bladder: Secondary | ICD-10-CM | POA: Diagnosis not present

## 2017-02-02 DIAGNOSIS — E782 Mixed hyperlipidemia: Secondary | ICD-10-CM

## 2017-02-02 DIAGNOSIS — I70209 Unspecified atherosclerosis of native arteries of extremities, unspecified extremity: Secondary | ICD-10-CM | POA: Diagnosis not present

## 2017-02-02 DIAGNOSIS — K219 Gastro-esophageal reflux disease without esophagitis: Secondary | ICD-10-CM | POA: Diagnosis not present

## 2017-02-02 DIAGNOSIS — I10 Essential (primary) hypertension: Secondary | ICD-10-CM | POA: Diagnosis not present

## 2017-02-02 DIAGNOSIS — G8929 Other chronic pain: Secondary | ICD-10-CM

## 2017-02-02 DIAGNOSIS — E785 Hyperlipidemia, unspecified: Secondary | ICD-10-CM | POA: Insufficient documentation

## 2017-02-02 DIAGNOSIS — F418 Other specified anxiety disorders: Secondary | ICD-10-CM | POA: Diagnosis not present

## 2017-02-02 MED ORDER — BACLOFEN 10 MG PO TABS: 5.0000 mg | ORAL_TABLET | Freq: Three times a day (TID) | ORAL | 2 refills | Status: DC | PRN

## 2017-02-02 MED ORDER — CLOPIDOGREL BISULFATE 75 MG PO TABS: 75.0000 mg | ORAL_TABLET | Freq: Every day | ORAL | 3 refills | Status: DC

## 2017-02-02 MED ORDER — MIRTAZAPINE 30 MG PO TABS: 30.0000 mg | ORAL_TABLET | Freq: Every day | ORAL | 5 refills | Status: DC

## 2017-02-02 MED ORDER — SERTRALINE HCL 50 MG PO TABS: 50.0000 mg | ORAL_TABLET | Freq: Every day | ORAL | 5 refills | Status: DC

## 2017-02-02 MED ORDER — AMLODIPINE BESYLATE 10 MG PO TABS: 10.0000 mg | ORAL_TABLET | Freq: Every day | ORAL | 3 refills | Status: DC

## 2017-02-02 MED ORDER — GABAPENTIN 100 MG PO CAPS: ORAL_CAPSULE | ORAL | 1 refills | Status: DC

## 2017-02-02 MED ORDER — ATORVASTATIN CALCIUM 40 MG PO TABS: 40.0000 mg | ORAL_TABLET | Freq: Every day | ORAL | 3 refills | Status: DC

## 2017-02-02 MED ORDER — MIRABEGRON ER 25 MG PO TB24: 25.0000 mg | ORAL_TABLET | Freq: Every day | ORAL | 6 refills | Status: DC

## 2017-02-02 MED ORDER — OMEPRAZOLE 20 MG PO CPDR: 20.0000 mg | DELAYED_RELEASE_CAPSULE | Freq: Every day | ORAL | 3 refills | Status: DC

## 2017-02-02 NOTE — Assessment & Plan Note (Signed)
Stable, controlled Refilled Protonix

## 2017-02-02 NOTE — Assessment & Plan Note (Signed)
Off meds for few months, had been improved. Refilled Zoloft and Mirtazapine Follow-up as needed, given handout location address of local self referral Psych/Counseling preferred [place]
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
## 2017-02-02 NOTE — Assessment & Plan Note (Signed)
Refilled Myrbetriq Follow-up as needed, consider referral to Urology if worsening

## 2017-02-02 NOTE — Assessment & Plan Note (Signed)
Based on report, no recent A1c for comparison Check future A1c at physical

## 2017-02-02 NOTE — Assessment & Plan Note (Signed)
Stable, well controlled No known complications Refilled Amlodipine 10mg  daily

## 2017-02-02 NOTE — Patient Instructions (Signed)
Thank you for coming in to clinic today.  1. For your Back Pain - I think that this is due to Muscle Spasms or strain. Your Sciatic Nerve can be affected causing some of your radiation and numbness down your legs. - Unfortunately we cannot use the Tylenol or NSAIDs due to your allergy, and these are the primary medicines that are often used for arthritis, this limits the options  Start Baclofen (Lioresal) 10mg  tablets - cut in half for 5mg  at night for muscle relaxant - may make you sedated or sleepy (be careful driving or working on this) if tolerated you can take every 8 hours, half or whole tab  Start Gabapentin 100mg  capsules, take at night for 2-3 nights only, and then increase to 2 times a day for a few days, and then may increase to 3 times a day, it may make you drowsy, if helps significantly at night only, then you can increase instead to 3 capsules at night, instead of 3 times a day - In the future if needed, we can significantly increase the dose if tolerated well, some common doses are 300mg  three times a day up to 600mg  three times a day, usually it takes several weeks or months to get to higher doses  Recommend to start using heating pad on your lower back 1-2x daily for few weeks  This pain may take weeks to months to fully resolve, but hopefully it will respond to the medicine initially. All back injuries (small or serious) are slow to heal since we use our back muscles every day. Be careful with turning, twisting, lifting, sitting / standing for prolonged periods, and avoid re-injury.  If your symptoms significantly worsen with more pain, or new symptoms with weakness in one or both legs, new or different shooting leg pains, numbness in legs or groin, loss of control or retention of urine or bowel movements, please call back for advice and you may need to go directly to the Emergency Department.   CALL AND SCHEDULE APT Self Referral [facility]) [place] [address] [place], [place] [address] Phone: [phone]  [employer], available walk-in 9am-4pm M-F [place], [place] [address] Hours: 9am - 4pm (M-F, walk in available) [phone]  Please schedule a follow-up appointment with Dr. Parks Ranger in 4-6 weeks low back pain  If you have any other questions or concerns, please feel free to call the clinic or send a message through [facility]. You may also schedule an earlier appointment if necessary.  [name], DO [place]

## 2017-02-02 NOTE — Assessment & Plan Note (Addendum)
Stable chronic problem Followed by Vascular Surgery, last visit 01/15/17 Continues on Plavix 75mg  daily. Atorvastatin 40mg , recent ultrasound reportedly normal Follow-up as needed

## 2017-02-02 NOTE — Assessment & Plan Note (Signed)
Chronic bilateral LBP with associated R sciatica, without recent worsening or injury, some regular flares with some heavy lifting (granddaughter frequently), setting of known chronic LBP with DJD, multiple levels with some disc involvement. - No red flag symptoms. Right leg SLR positive radiculopathy - No recent imaging, last outside x-rays 2014 and CT abdomen 2015 - confirm significant DJD - Inadequate conservative therapy (limited by NSAID and Tylenol allergy) - Checked [place] CSRS for past 1 year, has had several small amount rx opiates various types from various providers usually dentist and ED providers, from [place], [place] and [place]: 1. Discussion on limited options due to NSAID/Tylenol allergy, advised her that I do not routinely prescribe chronic pain management with opiates, as she is requesting this today. Reviewed risks of these medications. She seems to understand agrees for alternative therapy 2. Start muscle relaxant with Baclofen 10mg  tabs - take 5-10mg  up to TID PRN #90 +2 refills titrate up as tolerated 3. Start Gabapentin 100mg  capsules nightly slow titration over several days to weeks to 100mg  TID then likely higher doses as advised 4. Activity modification, encouraged use of heating pad 1-2x daily for now then PRN 5. Offered repeat x-rays, patient opts to defer imaging until next visit, repeat Lumbar X-ray 6. As requested placed referral to Chiropractor Dr Grant Fontana 7. Follow-up 4-6 weeks LBP, can discuss PT, dose adjust meds, alternative options, likely will need further advice from Ortho if persistent worsening, given significant DJD and radicular symptoms may benefit from injections or other therapy, however patient seems hesitant for these invasive options, should still discuss her options

## 2017-02-02 NOTE — Assessment & Plan Note (Signed)
Prior lipids abnormal in 02/2016 Refilled Atorvastatin Check fasting lipids in future at follow-up for annual physical

## 2017-02-05 ENCOUNTER — Telehealth: Payer: Self-pay | Admitting: Family Medicine

## 2017-02-05 NOTE — Telephone Encounter (Signed)
Could you please clarify if her symptoms were from taking BOTH Baclofen and Gabapentin? Or if it was just one of these medications causing her sedation / symptoms.  Unfortunately there are not many other options for her. She cannot take anti-inflammatories due to allergy as discussed during last visit.  Baclofen is the least sedating muscle relaxant, she may try cutting tablet in HALF for 5mg  only as needed to see if this helps.  Gabapentin can be sedating, but she should be on lowest dose possible ONE 100mg  nightly only, she should wait longer before increasing this to up to 2 or even 3 times a day.  Maybe she needs to take only ONE of these medications at a time for first week or two until adjusts then can add the other.  [name], [facility] 02/05/2017, 11:22 AM

## 2017-02-05 NOTE — Telephone Encounter (Signed)
Pt. Called states that  She  Didn't this that she could take baclofen, and gabapentin she states that she felt like she was drunk wanted to  Know if you would call her something else in.  Pt call back  # is [phone]

## 2017-02-05 NOTE — Telephone Encounter (Signed)
Pt advised and understood very well.

## 2017-02-11 ENCOUNTER — Telehealth: Payer: Self-pay | Admitting: Family Medicine

## 2017-02-11 NOTE — Telephone Encounter (Signed)
Pt asked to have antibiotic call in for UTI.  Her call back number is [phone]

## 2017-02-11 NOTE — Telephone Encounter (Signed)
Patient called back and was informed appointment needed.[place]

## 2017-02-11 NOTE — Telephone Encounter (Signed)
Patient needs to be seen in office for UTI, last visit we did not discuss these symptoms. She would need a urine test and culture prior to starting antibiotic.  She can follow-up with me or Adriana this Friday for acute visit. Or may go to Urgent Care for further treatment.  [name], [facility] 02/11/2017, 4:53 PM

## 2017-02-12 ENCOUNTER — Ambulatory Visit (INDEPENDENT_AMBULATORY_CARE_PROVIDER_SITE_OTHER): Payer: Medicare Other | Admitting: Family Medicine

## 2017-02-12 ENCOUNTER — Ambulatory Visit: Payer: Medicare Other | Admitting: Family Medicine

## 2017-02-12 ENCOUNTER — Encounter: Payer: Self-pay | Admitting: Family Medicine

## 2017-02-12 VITALS — BP 137/76 | HR 66 | Temp 98.5°F | Resp 16 | Ht 63.5 in | Wt 172.0 lb

## 2017-02-12 DIAGNOSIS — B379 Candidiasis, unspecified: Secondary | ICD-10-CM

## 2017-02-12 DIAGNOSIS — R319 Hematuria, unspecified: Secondary | ICD-10-CM

## 2017-02-12 DIAGNOSIS — N390 Urinary tract infection, site not specified: Secondary | ICD-10-CM

## 2017-02-12 DIAGNOSIS — T3695XA Adverse effect of unspecified systemic antibiotic, initial encounter: Secondary | ICD-10-CM

## 2017-02-12 DIAGNOSIS — N3289 Other specified disorders of bladder: Secondary | ICD-10-CM | POA: Diagnosis not present

## 2017-02-12 LAB — POCT URINALYSIS DIPSTICK
Bilirubin, UA: NEGATIVE
GLUCOSE UA: NEGATIVE
Ketones, UA: NEGATIVE
LEUKOCYTES UA: NEGATIVE
NITRITE UA: NEGATIVE
Protein, UA: NEGATIVE
SPEC GRAV UA: 1.01
UROBILINOGEN UA: NEGATIVE
pH, UA: 5

## 2017-02-12 MED ORDER — CEPHALEXIN 500 MG PO CAPS: 500.0000 mg | ORAL_CAPSULE | Freq: Three times a day (TID) | ORAL | 0 refills | Status: DC

## 2017-02-12 MED ORDER — FLUCONAZOLE 150 MG PO TABS: ORAL_TABLET | ORAL | 0 refills | Status: DC

## 2017-02-12 NOTE — Assessment & Plan Note (Addendum)
Likely contributing to her recurrent UTI, possibly if incomplete emptying or other abnormality. Previously managed by GYN Continue Myrbetriq for now Referral to establish with Urology [facility], anticipate urodynamics may be needed

## 2017-02-12 NOTE — Assessment & Plan Note (Signed)
Clinically consistent with suspected UTI on history, similar to prior UTIs, no recent treatment, last >2 months ago - Also with OAB symptoms LUTS and nocturia, has been on variety of treatment in past, currently Myrbetriq - No concern for pyelo today (no systemic symptoms, neg fever, back pain, n/v) - Not established with Urology  Plan: 1. Check UA today - trace blood, not entirely consistent, but will send for urine culture, and treat empirically, start Keflex 500mg  TID for 7 days 2. Improve PO hydration 3 Referral to Urology ([facility]) to establish for further work-up/management of recurrent UTIs, may benefit from chronic antibiotic prophylaxis (given reported >5-6 UTI yearly), also may benefit from formal urodynamics with her bladder/LUTS history 4. Return criteria given if acute worsening, follow-up as needed

## 2017-02-12 NOTE — Progress Notes (Signed)
Subjective:    Patient ID: Peggy Russo, female    DOB: March 30, 1956, 61 y.o.   MRN: [id number]  Peggy Russo is a 61 y.o. female presenting on 02/12/2017 for Urinary Tract Infection (onset week frquency and back pain  left arm pain onset 3 days )   HPI  UTI / H/o chronic recurrent UTIs / Overactive Bladder: - Reports recent symptoms with urinary odor, frequency, some low back pain onset 1 week ago, feels similar to other UTI she has had in past, admits usually x 1 UTI every 4-6 weeks recently. Has been treated with variety of antibiotics, cannot recall names - Followed by [facility]YN, had been managed for bladder instability in past with reported overactive bladder, symptoms of frequency and nocturia, she had been on Detrol in past, more recently switched to [facility] and has done better on this. - Not established with Urology - Denies any fevers, chills, sweats, nausea, vomiting, flank pain, gross hematuria   Social History  Substance Use Topics  . Smoking status: Current Every Day Smoker    Packs/day: 0.50    Years: 25.00    Types: Cigarettes  . Smokeless tobacco: Never Used  . Alcohol use No    Review of Systems Per HPI unless specifically indicated above     Objective:    BP 137/76   Pulse 66   Temp 98.5 F (36.9 C) (Oral)   Resp 16   Ht 5' 3.5" (1.613 m)   Wt 172 lb (78 kg)   BMI 29.99 kg/m   Wt Readings from Last 3 Encounters:  02/12/17 172 lb (78 kg)  02/02/17 169 lb (76.7 kg)  12/10/16 165 lb 6.4 oz (75 kg)    Physical Exam  Constitutional: She is oriented to person, place, and time. She appears well-developed and well-nourished. No distress.  Mostly well but tired appearing, mild discomfort with low back, cooperative  HENT:  Mouth/Throat: Oropharynx is clear and moist.  Eyes: Conjunctivae are normal.  Cardiovascular: Normal rate and intact distal pulses.   Pulmonary/Chest: Effort normal.  Abdominal: Soft. Bowel sounds are normal. She exhibits  no distension. There is no tenderness.  Neurological: She is alert and oriented to person, place, and time.  Skin: Skin is warm and dry. She is not diaphoretic.  Psychiatric: Her behavior is normal.  Nursing note and vitals reviewed.  I have personally reviewed the following lab results from 02/12/17.  Results for orders placed or performed in visit on 02/12/17  POCT Urinalysis Dipstick  Result Value Ref Range   Color, UA dark amber    Clarity, UA clear    Glucose, UA negative    Bilirubin, UA negative    Ketones, UA negative    Spec Grav, UA 1.010    Blood, UA trace    pH, UA 5.0    Protein, UA negative    Urobilinogen, UA negative    Nitrite, UA negative    Leukocytes, UA Negative Negative      Assessment & Plan:   Problem List Items Addressed This Visit    Unstable bladder    Likely contributing to her recurrent UTI, possibly if incomplete emptying or other abnormality. Previously managed by GYN Continue Myrbetriq for now Referral to establish with Urology [facility], anticipate urodynamics may be needed      Relevant Orders   Ambulatory referral to Urology   Recurrent UTI (urinary tract infection)    Clinically consistent with suspected UTI on history, similar to prior  UTIs, no recent treatment, last >2 months ago - Also with OAB symptoms LUTS and nocturia, has been on variety of treatment in past, currently Myrbetriq - No concern for pyelo today (no systemic symptoms, neg fever, back pain, n/v) - Not established with Urology  Plan: 1. Check UA today - trace blood, not entirely consistent, but will send for urine culture, and treat empirically, start Keflex 500mg  TID for 7 days 2. Improve PO hydration 3 Referral to Urology ([facility]) to establish for further work-up/management of recurrent UTIs, may benefit from chronic antibiotic prophylaxis (given reported >5-6 UTI yearly), also may benefit from formal urodynamics with her bladder/LUTS history 4. Return criteria given if acute  worsening, follow-up as needed      Relevant Medications   cephALEXin (KEFLEX) 500 MG capsule   fluconazole (DIFLUCAN) 150 MG tablet   Other Relevant Orders   Ambulatory referral to Urology    Other Visit Diagnoses    Urinary tract infection with hematuria, site unspecified    -  Primary   Relevant Medications   cephALEXin (KEFLEX) 500 MG capsule   fluconazole (DIFLUCAN) 150 MG tablet   Other Relevant Orders   POCT Urinalysis Dipstick (Completed)   CULTURE, URINE COMPREHENSIVE   Urine culture   Antibiotic-induced yeast infection       Precautionary rx DIflucan given if develops yeast infection following antibiotic course   Relevant Medications   cephALEXin (KEFLEX) 500 MG capsule   fluconazole (DIFLUCAN) 150 MG tablet      Meds ordered this encounter  Medications  . DISCONTD: tolterodine (DETROL LA) 4 MG 24 hr capsule    Refill:  6  . cephALEXin (KEFLEX) 500 MG capsule    Sig: Take 1 capsule (500 mg total) by mouth 3 (three) times daily.    Dispense:  21 capsule    Refill:  0  . fluconazole (DIFLUCAN) 150 MG tablet    Sig: Take one tablet by mouth on Day 1. Repeat dose 2nd tablet on Day 3.    Dispense:  2 tablet    Refill:  0      Follow up plan: Return in about 4 weeks (around 03/12/2017), or if symptoms worsen or fail to improve.  [name], [facility] 02/12/2017, 2:53 PM

## 2017-02-12 NOTE — Patient Instructions (Signed)
Thank you for coming in to clinic today.  1. You have a Urinary Tract Infection - this is very common, your symptoms are reassuring and you should get better within 1 week on the antibiotics - Start Keflex 500mg  3 times daily for next 7 days, complete entire course, even if feeling better - We sent urine for a culture, we will call you within next few days if we need to change antibiotics - Please drink plenty of fluids, improve hydration over next 1 week  Referral sent to Urology - if you do not hear back about appointment, then call them in 2 weeks to check status. - Ask them about Recurrent UTI, may need Preventative Antibiotic for recurrent UTI  [facility] [address] [place] Phone: [phone]  If symptoms worsening, developing nausea / vomiting, worsening back pain, fevers / chills / sweats, then please return for re-evaluation sooner.  Please schedule a follow-up appointment with Dr. Parks Ranger in 1-2 weeks as needed if not improved UTI  If you have any other questions or concerns, please feel free to call the clinic or send a message through [place]. You may also schedule an earlier appointment if necessary.  [name], DO [place]

## 2017-02-13 LAB — URINE CULTURE: Organism ID, Bacteria: NO GROWTH

## 2017-03-13 ENCOUNTER — Ambulatory Visit (INDEPENDENT_AMBULATORY_CARE_PROVIDER_SITE_OTHER): Payer: Medicare Other | Admitting: Family Medicine

## 2017-03-13 ENCOUNTER — Encounter: Payer: Self-pay | Admitting: Family Medicine

## 2017-03-13 VITALS — BP 144/81 | HR 77 | Temp 98.4°F | Resp 16 | Ht 63.5 in | Wt 168.0 lb

## 2017-03-13 DIAGNOSIS — Z206 Contact with and (suspected) exposure to human immunodeficiency virus [HIV]: Secondary | ICD-10-CM | POA: Diagnosis not present

## 2017-03-13 DIAGNOSIS — M48061 Spinal stenosis, lumbar region without neurogenic claudication: Secondary | ICD-10-CM

## 2017-03-13 DIAGNOSIS — Z9189 Other specified personal risk factors, not elsewhere classified: Secondary | ICD-10-CM

## 2017-03-13 DIAGNOSIS — G8929 Other chronic pain: Secondary | ICD-10-CM | POA: Diagnosis not present

## 2017-03-13 DIAGNOSIS — M5441 Lumbago with sciatica, right side: Secondary | ICD-10-CM | POA: Diagnosis not present

## 2017-03-13 MED ORDER — PREDNISONE 20 MG PO TABS: ORAL_TABLET | ORAL | 0 refills | Status: DC

## 2017-03-13 NOTE — Patient Instructions (Addendum)
Thank you for coming in to clinic today.  1. For your Back Pain - I think that this is due to Muscle Spasms or strain. Your Sciatic Nerve can be affected causing some of your radiation and numbness down your legs.  - Unfortunately we cannot use the Tylenol or NSAIDs due to your allergy, and these are the primary medicines that are often used for arthritis, this limits the options  Stop Gabapentin and Baclofen  Start Prednisone taper over 7 days, this is a strong anti-inflammatory steroid, you cannot take ibuprofen, aleve, advil with this medication  Recommend to start using heating pad on your lower back 1-2x daily for few weeks  This pain may take weeks to months to fully resolve, but hopefully it will respond to the medicine initially. All back injuries (small or serious) are slow to heal since we use our back muscles every day. Be careful with turning, twisting, lifting, sitting / standing for prolonged periods, and avoid re-injury.  If your symptoms significantly worsen with more pain, or new symptoms with weakness in one or both legs, new or different shooting leg pains, numbness in legs or groin, loss of control or retention of urine or bowel movements, please call back for advice and you may need to go directly to the Emergency Department.   [facility] [place] [address] [place] [address] [place], [place] [address] Phone: [phone]  CALL AND SCHEDULE APT Self Referral [facility]) [place] [address], [place], [place] [address] Phone: [phone]  [employer], available walk-in 9am-4pm M-F [place], [place] [address] Hours: 9am - 4pm (M-F, walk in available) [phone]  Please schedule a follow-up appointment with Dr. Parks Ranger 6 weeks to 3 months for Annual Physical  If you have any other questions or concerns, please feel free to call the clinic  or send a message through [place]. You may also schedule an earlier appointment if necessary.  [name], DO [place]
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
## 2017-03-13 NOTE — Assessment & Plan Note (Signed)
Requests HIV screen today, husband is HIV positive and she is at risk for exposure. - last negative HIV screen 01/2016 - Check HIV ab today
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
## 2017-03-14 LAB — HIV ANTIBODY (ROUTINE TESTING W REFLEX): HIV 1&2 Ab, 4th Generation: NONREACTIVE

## 2017-03-23 ENCOUNTER — Ambulatory Visit (INDEPENDENT_AMBULATORY_CARE_PROVIDER_SITE_OTHER): Payer: Medicare Other | Admitting: Urology

## 2017-03-23 ENCOUNTER — Encounter: Payer: Self-pay | Admitting: Urology

## 2017-03-23 VITALS — BP 148/83 | HR 73 | Ht 65.0 in | Wt 167.3 lb

## 2017-03-23 DIAGNOSIS — N3020 Other chronic cystitis without hematuria: Secondary | ICD-10-CM | POA: Diagnosis not present

## 2017-03-23 DIAGNOSIS — R351 Nocturia: Secondary | ICD-10-CM

## 2017-03-23 DIAGNOSIS — I70209 Unspecified atherosclerosis of native arteries of extremities, unspecified extremity: Secondary | ICD-10-CM

## 2017-03-23 DIAGNOSIS — N390 Urinary tract infection, site not specified: Secondary | ICD-10-CM

## 2017-03-23 LAB — URINALYSIS, COMPLETE
BILIRUBIN UA: NEGATIVE
Glucose, UA: NEGATIVE
KETONES UA: NEGATIVE
Leukocytes, UA: NEGATIVE
NITRITE UA: NEGATIVE
SPEC GRAV UA: 1.02 (ref 1.005–1.030)
UUROB: 0.2 mg/dL (ref 0.2–1.0)
pH, UA: 6 (ref 5.0–7.5)

## 2017-03-23 LAB — BLADDER SCAN AMB NON-IMAGING: Scan Result: 0

## 2017-03-23 LAB — MICROSCOPIC EXAMINATION

## 2017-03-23 NOTE — Progress Notes (Signed)
03/23/2017 10:13 AM   Peggy Russo 07/21/1956 [id number]  Referring provider: Olin Hauser, DO [address] [place], [place] [address]  Chief Complaint  Patient presents with  . New Patient (Initial Visit)    urinary frequency, recurrent UTI    HPI: The patient's primary complaint is nighttime frequency voiding 10-11 times a day. She voids every 4 hours during the day and she is continent. She has failed oxybutynin tolterodine in the beta 3 agonists at the lower dosage. She has passed kidney stones. She reports 10 bladder infections a year with odor and back pain that might respond favorably antibiotics. A last 3 urine cultures in her chart were negative  Importantly she takes a beta blocker and is on prednisone.  She has not had bladder or kidney surgery. She has no neurologic issues. She is prone to constipation  Modifying factors: There are no other modifying factors  Associated signs and symptoms: There are no other associated signs and symptoms Aggravating and relieving factors: There are no other aggravating or relieving factors Severity: Moderate Duration: Persistent     PMH: Past Medical History:  Diagnosis Date  . Abnormal Pap smear of cervix 07/2015   needed Leep- did not get one  . Anxiety   . Depression   . GERD (gastroesophageal reflux disease)   . High cholesterol   . Hypertension     Surgical History: Past Surgical History:  Procedure Laterality Date  . CESAREAN SECTION    . PANCREAS BIOPSY     Abnormality, resulted to be non malignant  . SPLENECTOMY, TOTAL     Removal of spleen in order to obtain pancreatic biopsy  . stent leg    . TUBAL LIGATION      Home Medications:  Allergies as of 03/23/2017      Reactions   Acetaminophen Itching   Patient states that she tolerates Percocet (oxycodone/APAP).   Hydromorphone Other (See Comments)      Medication List       Accurate as of 03/23/17 10:13 AM. Always use your most recent med list.          amLODipine 10 MG tablet Commonly known as:  NORVASC Take 1 tablet (10 mg total) by mouth daily.   aspirin EC 81 MG tablet Take by mouth.   atorvastatin 40 MG tablet Commonly known as:  LIPITOR Take 1 tablet (40 mg total) by mouth daily.   fluconazole 150 MG tablet Commonly known as:  DIFLUCAN Take one tablet by mouth on Day 1. Repeat dose 2nd tablet on Day 3.   mirabegron ER 25 MG Tb24 tablet Commonly known as:  MYRBETRIQ Take 1 tablet (25 mg total) by mouth daily.   mirtazapine 30 MG tablet Commonly known as:  REMERON Take 1 tablet (30 mg total) by mouth at bedtime.   omeprazole 20 MG capsule Commonly known as:  PRILOSEC Take 1 capsule (20 mg total) by mouth daily before breakfast.   ONE-A-DAY WOMENS 50+ ADVANTAGE Tabs Take by mouth.   predniSONE 20 MG tablet Commonly known as:  DELTASONE Take daily with food. Start with 60mg  (3 pills) x 2 days, then reduce to 40mg  (2 pills) x 2 days, then 20mg  (1 pill) x 3 days   sertraline 50 MG tablet Commonly known as:  ZOLOFT Take 1 tablet (50 mg total) by mouth daily.   tolterodine 4 MG 24 hr capsule Commonly known as:  DETROL LA       Allergies:  Allergies  Allergen Reactions  . Acetaminophen Itching    Patient states that she tolerates Percocet (oxycodone/APAP).  . Hydromorphone Other (See Comments)    Family History: Family History  Problem Relation Age of Onset  . Hypertension Mother   . Cancer Neg Hx   . Diabetes Neg Hx   . Ovarian cancer Neg Hx   . Heart disease Neg Hx     Social History:  reports that she has been smoking Cigarettes.  She has a 12.50 pack-year smoking history. She has never used smokeless tobacco. She reports that she uses drugs, including Marijuana. She reports that she does not drink alcohol.  ROS: UROLOGY Frequent Urination?: Yes Hard to postpone urination?: Yes Burning/pain with urination?: No Get up at night to urinate?: Yes Leakage of urine?: Yes Urine stream starts  and stops?: No Trouble starting stream?: No Do you have to strain to urinate?: No Blood in urine?: No Urinary tract infection?: No Sexually transmitted disease?: No Injury to kidneys or bladder?: No Painful intercourse?: No Weak stream?: No Currently pregnant?: No Vaginal bleeding?: No Last menstrual period?: No  Gastrointestinal Nausea?: No Vomiting?: No Indigestion/heartburn?: No Diarrhea?: No Constipation?: Yes  Constitutional Fever: No Night sweats?: No Weight loss?: No Fatigue?: No  Skin Skin rash/lesions?: No Itching?: No  Eyes Blurred vision?: No Double vision?: No  Ears/Nose/Throat Sore throat?: No Sinus problems?: No  Hematologic/Lymphatic Swollen glands?: No Easy bruising?: No  Cardiovascular Leg swelling?: No Chest pain?: No  Respiratory Cough?: No Shortness of breath?: No  Endocrine Excessive thirst?: Yes  Musculoskeletal Back pain?: Yes Joint pain?: Yes  Neurological Headaches?: No Dizziness?: No  Psychologic Depression?: No Anxiety?: Yes  Physical Exam: BP (!) 148/83   Pulse 73   Ht 5\' 5"  (1.651 m)   Wt 167 lb 4.8 oz (75.9 kg)   BMI 27.84 kg/m   Constitutional:  Alert and oriented, No acute distress. HEENT: [place] AT, moist mucus membranes.  Trachea midline, no masses. Cardiovascular: No clubbing, cyanosis, or edema. Respiratory: Normal respiratory effort, no increased work of breathing. GI: Abdomen is soft, nontender, nondistended, no abdominal masses GU: No CVA tenderness. Good bladder support and grade 1 cystocele Skin: No rashes, bruises or suspicious lesions. Lymph: No cervical or inguinal adenopathy. Neurologic: Grossly intact, no focal deficits, moving all 4 extremities. Psychiatric: Normal mood and affect.  Laboratory Data: No results found for: WBC, HGB, HCT, MCV, PLT  Lab Results  Component Value Date   CREATININE 1.0 02/22/2016    No results found for: PSA  No results found for: TESTOSTERONE  No  results found for: HGBA1C  Urinalysis    Component Value Date/Time   COLORURINE YELLOW (A) 10/07/2016 1344   APPEARANCEUR CLOUDY (A) 10/07/2016 1344   LABSPEC 1.024 10/07/2016 1344   PHURINE 5.0 10/07/2016 1344   GLUCOSEU NEGATIVE 10/07/2016 1344   HGBUR 1+ (A) 10/07/2016 1344   BILIRUBINUR negative 02/12/2017 1432   KETONESUR TRACE (A) 10/07/2016 1344   PROTEINUR negative 02/12/2017 1432   PROTEINUR 30 (A) 10/07/2016 1344   UROBILINOGEN negative 02/12/2017 1432   NITRITE negative 02/12/2017 1432   NITRITE NEGATIVE 10/07/2016 1344   LEUKOCYTESUR Negative 02/12/2017 1432    Pertinent Imaging: None  Assessment & Plan:  The patient has significant nighttime frequency. I'm not convinced she truly gets bladder infection. I probably will not recommend prophylaxis in the future without positive cultures. Having said that I thought it was very reasonable to get a renal ultrasound especially with her previous kidney stone history. The  prednisone is only short-term for 1 week due to back issues and she is seeing orthopedics soon. The role of a voiding diary was discussed and given.  The patient will be reassessed in 3-4 weeks on the beta 3 agonists at 50 mg and tolterodine. She thinks the medication was helping a little bit currently. She will do a voiding diary. She will have a renal ultrasound. When she comes back I will perform cystoscopy for safety reasons with a smoking history. Urine was sent for culture  1. OAB 2. Urinary tract infections 3. Nighttime frequency   - Urinalysis, Complete - Bladder Scan (Post Void Residual) in office - CULTURE, URINE COMPREHENSIVE   No Follow-up on file.  Reece Packer, MD  [facility] [address] [place], [place] [address] [phone]

## 2017-03-29 LAB — CULTURE, URINE COMPREHENSIVE

## 2017-03-30 ENCOUNTER — Telehealth: Payer: Self-pay

## 2017-03-30 DIAGNOSIS — N390 Urinary tract infection, site not specified: Secondary | ICD-10-CM

## 2017-03-30 MED ORDER — CIPROFLOXACIN HCL 250 MG PO TABS: 250.0000 mg | ORAL_TABLET | Freq: Two times a day (BID) | ORAL | 0 refills | Status: DC

## 2017-03-30 NOTE — Telephone Encounter (Signed)
Bjorn Loser, MD  Lestine Box, LPN        cipro 478 mg bid for one week  weeks    Spoke with pt in reference to +ucx and abx. Pt voiced understanding.

## 2017-03-31 ENCOUNTER — Other Ambulatory Visit: Payer: Self-pay | Admitting: Neurosurgery

## 2017-03-31 ENCOUNTER — Ambulatory Visit (INDEPENDENT_AMBULATORY_CARE_PROVIDER_SITE_OTHER): Payer: Medicare Other

## 2017-03-31 DIAGNOSIS — M4186 Other forms of scoliosis, lumbar region: Secondary | ICD-10-CM

## 2017-03-31 DIAGNOSIS — G8929 Other chronic pain: Secondary | ICD-10-CM

## 2017-03-31 DIAGNOSIS — M545 Low back pain: Secondary | ICD-10-CM | POA: Diagnosis not present

## 2017-03-31 DIAGNOSIS — M5441 Lumbago with sciatica, right side: Principal | ICD-10-CM

## 2017-03-31 DIAGNOSIS — I998 Other disorder of circulatory system: Secondary | ICD-10-CM | POA: Diagnosis not present

## 2017-04-04 DIAGNOSIS — M5441 Lumbago with sciatica, right side: Secondary | ICD-10-CM | POA: Diagnosis not present

## 2017-04-04 DIAGNOSIS — M48061 Spinal stenosis, lumbar region without neurogenic claudication: Secondary | ICD-10-CM | POA: Diagnosis not present

## 2017-04-07 DIAGNOSIS — R262 Difficulty in walking, not elsewhere classified: Secondary | ICD-10-CM | POA: Diagnosis not present

## 2017-04-07 DIAGNOSIS — M5441 Lumbago with sciatica, right side: Secondary | ICD-10-CM | POA: Diagnosis not present

## 2017-04-07 DIAGNOSIS — M5442 Lumbago with sciatica, left side: Secondary | ICD-10-CM | POA: Diagnosis not present

## 2017-04-13 DIAGNOSIS — R262 Difficulty in walking, not elsewhere classified: Secondary | ICD-10-CM | POA: Diagnosis not present

## 2017-04-13 DIAGNOSIS — M5442 Lumbago with sciatica, left side: Secondary | ICD-10-CM | POA: Diagnosis not present

## 2017-04-13 DIAGNOSIS — M5441 Lumbago with sciatica, right side: Secondary | ICD-10-CM | POA: Diagnosis not present

## 2017-04-16 ENCOUNTER — Ambulatory Visit
Admission: RE | Admit: 2017-04-16 | Discharge: 2017-04-16 | Disposition: A | Discharge status: Home / Self Care | Payer: Medicare Other | Source: Ambulatory Visit | Attending: Urology | Admitting: Urology

## 2017-04-16 DIAGNOSIS — N390 Urinary tract infection, site not specified: Secondary | ICD-10-CM

## 2017-04-20 ENCOUNTER — Encounter: Payer: Self-pay | Admitting: Urology

## 2017-04-20 ENCOUNTER — Ambulatory Visit (INDEPENDENT_AMBULATORY_CARE_PROVIDER_SITE_OTHER): Payer: Medicare Other | Admitting: Urology

## 2017-04-20 VITALS — BP 139/77 | HR 78 | Ht 65.0 in | Wt 162.5 lb

## 2017-04-20 DIAGNOSIS — R351 Nocturia: Secondary | ICD-10-CM | POA: Diagnosis not present

## 2017-04-20 DIAGNOSIS — N390 Urinary tract infection, site not specified: Secondary | ICD-10-CM

## 2017-04-20 DIAGNOSIS — I70209 Unspecified atherosclerosis of native arteries of extremities, unspecified extremity: Secondary | ICD-10-CM | POA: Diagnosis not present

## 2017-04-20 LAB — MICROSCOPIC EXAMINATION: Epithelial Cells (non renal): >10 /hpf — ABNORMAL HIGH (ref 0–10)

## 2017-04-20 LAB — URINALYSIS, COMPLETE
Bilirubin, UA: NEGATIVE
GLUCOSE, UA: NEGATIVE
LEUKOCYTES UA: NEGATIVE
Nitrite, UA: NEGATIVE
SPEC GRAV UA: 1.03 (ref 1.005–1.030)
Urobilinogen, Ur: 0.2 mg/dL (ref 0.2–1.0)
pH, UA: 6 (ref 5.0–7.5)

## 2017-04-20 MED ORDER — LIDOCAINE HCL 2 % EX GEL
1.0000 "application " | Freq: Once | CUTANEOUS | Status: AC
  Administered 2017-04-20: 1 via URETHRAL

## 2017-04-20 MED ORDER — MIRABEGRON ER 50 MG PO TB24: 50.0000 mg | ORAL_TABLET | Freq: Every day | ORAL | 11 refills | Status: AC

## 2017-04-20 MED ORDER — CIPROFLOXACIN HCL 500 MG PO TABS
500.0000 mg | ORAL_TABLET | Freq: Once | ORAL | Status: AC
  Administered 2017-04-20: 500 mg via ORAL

## 2017-04-20 NOTE — Progress Notes (Signed)
04/20/2017 10:41 AM   Pearline Cables 15-Jun-1956 [id number]  Referring provider: Olin Hauser, DO [address] [place], [place] [address]  Chief Complaint  Patient presents with  . Cysto    HPI: The patient's primary complaint is nighttime frequency voiding 10-11 times a day. She voids every 4 hours during the day and she is continent. She has failed oxybutynin tolterodine in the beta 3 agonists at the lower dosage. She has passed kidney stones. She reports 10 bladder infections a year with odor and back pain that might respond favorably antibiotics. A last 3 urine cultures in her chart were negative  Importantly she takes a beta blocker and is on prednisone.   The patient has significant nighttime frequency. I'm not convinced she truly gets bladder infection. I probably will not recommend prophylaxis in the future without positive cultures. Having said that I thought it was very reasonable to get a renal ultrasound especially with her previous kidney stone history. The prednisone is only short-term for 1 week due to back issues and she is seeing orthopedics soon. The role of a voiding diary was discussed and given.  The patient will be reassessed in 3-4 weeks on the beta 3 agonists at 50 mg and tolterodine. She thinks the medication was helping a little bit currently. She will do a voiding diary. She will have a renal ultrasound. When she comes back I will perform cystoscopy for safety reasons with a smoking history. Urine was sent for culture  Today Frequency stable. Renal ultrasound normal Last urine culture was positive with a low colony count of Staphylococcus The patient is on the beta 3 agonists as monotherapy and now only getting up once at night. It is difficult to say if treating her bladder infection was the cause but she is doing great  After written consent she underwent cystoscopy. The bladder mucosa and trigone were normal. There was no stitch or foreign body or  carcinoma. There was no cystitis. She tolerated the procedure very well    PMH: Past Medical History:  Diagnosis Date  . Abnormal Pap smear of cervix 07/2015   needed Leep- did not get one  . Anxiety   . Depression   . GERD (gastroesophageal reflux disease)   . High cholesterol   . Hypertension     Surgical History: Past Surgical History:  Procedure Laterality Date  . CESAREAN SECTION    . PANCREAS BIOPSY     Abnormality, resulted to be non malignant  . SPLENECTOMY, TOTAL     Removal of spleen in order to obtain pancreatic biopsy  . stent leg    . TUBAL LIGATION      Home Medications:  Allergies as of 04/20/2017      Reactions   Acetaminophen Itching   Patient states that she tolerates Percocet (oxycodone/APAP).   Hydromorphone Other (See Comments)      Medication List       Accurate as of 04/20/17 10:41 AM. Always use your most recent med list.          amLODipine 10 MG tablet Commonly known as:  NORVASC Take 1 tablet (10 mg total) by mouth daily.   aspirin EC 81 MG tablet Take by mouth.   atorvastatin 40 MG tablet Commonly known as:  LIPITOR Take 1 tablet (40 mg total) by mouth daily.   fluconazole 150 MG tablet Commonly known as:  DIFLUCAN Take one tablet by mouth on Day 1. Repeat dose 2nd tablet on Day  3.   mirabegron ER 25 MG Tb24 tablet Commonly known as:  MYRBETRIQ Take 1 tablet (25 mg total) by mouth daily.   mirtazapine 30 MG tablet Commonly known as:  REMERON Take 1 tablet (30 mg total) by mouth at bedtime.   omeprazole 20 MG capsule Commonly known as:  PRILOSEC Take 1 capsule (20 mg total) by mouth daily before breakfast.   ONE-A-DAY WOMENS 50+ ADVANTAGE Tabs Take by mouth.   predniSONE 20 MG tablet Commonly known as:  DELTASONE Take daily with food. Start with 60mg  (3 pills) x 2 days, then reduce to 40mg  (2 pills) x 2 days, then 20mg  (1 pill) x 3 days   sertraline 50 MG tablet Commonly known as:  ZOLOFT Take 1 tablet (50 mg  total) by mouth daily.   tolterodine 4 MG 24 hr capsule Commonly known as:  DETROL LA       Allergies:  Allergies  Allergen Reactions  . Acetaminophen Itching    Patient states that she tolerates Percocet (oxycodone/APAP).  . Hydromorphone Other (See Comments)    Family History: Family History  Problem Relation Age of Onset  . Hypertension Mother   . Cancer Neg Hx   . Diabetes Neg Hx   . Ovarian cancer Neg Hx   . Heart disease Neg Hx     Social History:  reports that she has been smoking Cigarettes.  She has a 12.50 pack-year smoking history. She has never used smokeless tobacco. She reports that she uses drugs, including Marijuana. She reports that she does not drink alcohol.  ROS:                                        Physical Exam: BP 139/77 (BP Location: Left Arm, Patient Position: Sitting, Cuff Size: Normal)   Pulse 78   Ht 5\' 5"  (1.651 m)   Wt 73.7 kg (162 lb 8 oz)   BMI 27.04 kg/m    Laboratory Data: No results found for: WBC, HGB, HCT, MCV, PLT  Lab Results  Component Value Date   CREATININE 1.0 02/22/2016    No results found for: PSA  No results found for: TESTOSTERONE  No results found for: HGBA1C  Urinalysis    Component Value Date/Time   COLORURINE YELLOW (A) 10/07/2016 1344   APPEARANCEUR Clear 03/23/2017 0933   LABSPEC 1.024 10/07/2016 1344   PHURINE 5.0 10/07/2016 1344   GLUCOSEU Negative 03/23/2017 0933   HGBUR 1+ (A) 10/07/2016 1344   BILIRUBINUR Negative 03/23/2017 0933   KETONESUR TRACE (A) 10/07/2016 1344   PROTEINUR 1+ (A) 03/23/2017 0933   PROTEINUR 30 (A) 10/07/2016 1344   UROBILINOGEN negative 02/12/2017 1432   NITRITE Negative 03/23/2017 0933   NITRITE NEGATIVE 10/07/2016 1344   LEUKOCYTESUR Negative 03/23/2017 0933    Pertinent Imaging: As above  Assessment & Plan:  The patient is doing great getting up once at night. I will see her back in 3 months on a beta 3 agonist. Clinically she was not  infected today. I am not yet going to put her on prophylaxis  1. Recurrent UTI  - Urinalysis, Complete - ciprofloxacin (CIPRO) tablet 500 mg; Take 1 tablet (500 mg total) by mouth once. - lidocaine (XYLOCAINE) 2 % jelly 1 application; Place 1 application into the urethra once.  2. OAB   - Urinalysis, Complete - ciprofloxacin (CIPRO) tablet 500 mg; Take 1 tablet (500  mg total) by mouth once. - lidocaine (XYLOCAINE) 2 % jelly 1 application; Place 1 application into the urethra once.   Return in about 4 months (around 08/21/2017).  Reece Packer, MD  [facility] [address] [place], [place] [address] [phone]

## 2017-04-30 DIAGNOSIS — M5442 Lumbago with sciatica, left side: Secondary | ICD-10-CM | POA: Diagnosis not present

## 2017-04-30 DIAGNOSIS — R262 Difficulty in walking, not elsewhere classified: Secondary | ICD-10-CM | POA: Diagnosis not present

## 2017-04-30 DIAGNOSIS — M5441 Lumbago with sciatica, right side: Secondary | ICD-10-CM | POA: Diagnosis not present

## 2017-05-07 DIAGNOSIS — M5441 Lumbago with sciatica, right side: Secondary | ICD-10-CM | POA: Diagnosis not present

## 2017-05-07 DIAGNOSIS — R262 Difficulty in walking, not elsewhere classified: Secondary | ICD-10-CM | POA: Diagnosis not present

## 2017-05-07 DIAGNOSIS — M5442 Lumbago with sciatica, left side: Secondary | ICD-10-CM | POA: Diagnosis not present

## 2017-05-12 DIAGNOSIS — R262 Difficulty in walking, not elsewhere classified: Secondary | ICD-10-CM | POA: Diagnosis not present

## 2017-05-12 DIAGNOSIS — M5442 Lumbago with sciatica, left side: Secondary | ICD-10-CM | POA: Diagnosis not present

## 2017-05-12 DIAGNOSIS — M5441 Lumbago with sciatica, right side: Secondary | ICD-10-CM | POA: Diagnosis not present

## 2017-05-14 DIAGNOSIS — M5441 Lumbago with sciatica, right side: Secondary | ICD-10-CM | POA: Diagnosis not present

## 2017-05-14 DIAGNOSIS — R262 Difficulty in walking, not elsewhere classified: Secondary | ICD-10-CM | POA: Diagnosis not present

## 2017-05-14 DIAGNOSIS — M5442 Lumbago with sciatica, left side: Secondary | ICD-10-CM | POA: Diagnosis not present

## 2017-05-21 ENCOUNTER — Telehealth: Payer: Self-pay | Admitting: Family Medicine

## 2017-05-21 DIAGNOSIS — G8929 Other chronic pain: Secondary | ICD-10-CM

## 2017-05-21 DIAGNOSIS — M48061 Spinal stenosis, lumbar region without neurogenic claudication: Secondary | ICD-10-CM

## 2017-05-21 DIAGNOSIS — M5441 Lumbago with sciatica, right side: Principal | ICD-10-CM

## 2017-05-21 NOTE — Telephone Encounter (Signed)
Pt called, she's upset with the neurologist she was referred to and stated she will not return to that office. Please call pt, [phone].

## 2017-05-22 NOTE — Telephone Encounter (Signed)
[facility] they will fax [place] the records also spoke with patient need another referral.

## 2017-05-25 NOTE — Telephone Encounter (Signed)
Pt had seen by them only once rest of the appointment was cancellation and No Shows.

## 2017-05-25 NOTE — Telephone Encounter (Signed)
[facility] once the report for MRI will sign they will approve request from their provider and then will fax [place].

## 2017-05-25 NOTE — Telephone Encounter (Signed)
Called patient back today 6/18, see below had attempted to contact last week but unable to reach patient. I have received the fax record from [place] from dated of visit 03/31/17 only, I do not have her results from Lumbar MRI or repeat office follow-up in May or June 2018 to review MRI results.  Patient reports that based on patient preference she would like to be referred to a new neurosurgeon, she can go to many other locations, and is interested in [facility] for her care, she would like referral to [facility]. They would need to obtain records from [facility] and MRI results.  I advised her that even though this is patient preference for change, there are only so many limited options available and I do not routinely recommend switching specialists often, but she is more than welcome to try to find a provider that she likes more.  I have placed referral order to [facility].  [name], [facility] 05/25/2017, 1:16 PM

## 2017-06-09 ENCOUNTER — Ambulatory Visit (INDEPENDENT_AMBULATORY_CARE_PROVIDER_SITE_OTHER): Payer: Medicare Other

## 2017-06-09 VITALS — BP 130/70 | HR 82 | Temp 98.5°F | Resp 16 | Ht 65.0 in | Wt 165.4 lb

## 2017-06-09 DIAGNOSIS — Z1159 Encounter for screening for other viral diseases: Secondary | ICD-10-CM

## 2017-06-09 DIAGNOSIS — Z0000 Encounter for general adult medical examination without abnormal findings: Secondary | ICD-10-CM

## 2017-06-09 NOTE — Progress Notes (Signed)
Subjective:   Peggy Russo is a 61 y.o. female who presents for Medicare Annual (Subsequent) preventive examination.  Review of Systems:   Cardiac Risk Factors include: hypertension;dyslipidemia;smoking/ tobacco exposure     Objective:     Vitals: BP 130/70 (BP Location: Left Arm, Patient Position: Sitting)   Pulse 82   Temp 98.5 F (36.9 C)   Resp 16   Ht 5\' 5"  (1.651 m)   Wt 165 lb 6.4 oz (75 kg)   BMI 27.52 kg/m   Body mass index is 27.52 kg/m.   Tobacco History  Smoking Status  . Current Every Day Smoker  . Packs/day: 0.50  . Years: 25.00  . Types: Cigarettes  Smokeless Tobacco  . Never Used     Ready to quit: Yes Counseling given: Yes   Past Medical History:  Diagnosis Date  . Abnormal Pap smear of cervix 07/2015   needed Leep- did not get one  . Anxiety   . Depression   . GERD (gastroesophageal reflux disease)   . High cholesterol   . Hypertension    Past Surgical History:  Procedure Laterality Date  . CESAREAN SECTION    . PANCREAS BIOPSY     Abnormality, resulted to be non malignant  . SPLENECTOMY, TOTAL     Removal of spleen in order to obtain pancreatic biopsy  . stent leg    . TUBAL LIGATION     Family History  Problem Relation Age of Onset  . Hypertension Mother   . Cancer Neg Hx   . Diabetes Neg Hx   . Ovarian cancer Neg Hx   . Heart disease Neg Hx    History  Sexual Activity  . Sexual activity: No    Outpatient Encounter Prescriptions as of 06/09/2017  Medication Sig  . amLODipine (NORVASC) 10 MG tablet Take 1 tablet (10 mg total) by mouth daily.  Marland Kitchen aspirin EC 81 MG tablet Take by mouth.  Marland Kitchen atorvastatin (LIPITOR) 40 MG tablet Take 1 tablet (40 mg total) by mouth daily.  . mirabegron ER (MYRBETRIQ) 50 MG TB24 tablet Take 1 tablet (50 mg total) by mouth daily.  . mirtazapine (REMERON) 30 MG tablet Take 1 tablet (30 mg total) by mouth at bedtime.  . Multiple Vitamins-Minerals (ONE-A-DAY WOMENS 50+ ADVANTAGE) TABS Take by mouth.    Marland Kitchen omeprazole (PRILOSEC) 20 MG capsule Take 1 capsule (20 mg total) by mouth daily before breakfast.  . sertraline (ZOLOFT) 50 MG tablet Take 1 tablet (50 mg total) by mouth daily. (Patient not taking: Reported on 04/20/2017)  . tolterodine (DETROL LA) 4 MG 24 hr capsule   . [DISCONTINUED] fluconazole (DIFLUCAN) 150 MG tablet Take one tablet by mouth on Day 1. Repeat dose 2nd tablet on Day 3. (Patient not taking: Reported on 03/23/2017)  . [DISCONTINUED] predniSONE (DELTASONE) 20 MG tablet Take daily with food. Start with 60mg  (3 pills) x 2 days, then reduce to 40mg  (2 pills) x 2 days, then 20mg  (1 pill) x 3 days (Patient not taking: Reported on 04/20/2017)   No facility-administered encounter medications on file as of 06/09/2017.     Activities of Daily Living In your present state of health, do you have any difficulty performing the following activities: 06/09/2017 02/02/2017  Hearing? N N  Vision? Y N  Difficulty concentrating or making decisions? N N  Walking or climbing stairs? N Y  Dressing or bathing? N N  Doing errands, shopping? N N  Preparing Food and eating ? N -  Using the Toilet? N -  In the past six months, have you accidently leaked urine? N -  Do you have problems with loss of bowel control? N -  Managing your Medications? N -  Managing your Finances? N -  Housekeeping or managing your Housekeeping? N -  Some recent data might be hidden    Patient Care Team: Olin Hauser, DO as PCP - General (Family Medicine)    Assessment:     Exercise Activities and Dietary recommendations Current Exercise Habits: Home exercise routine, Type of exercise: stretching, Time (Minutes): 30, Frequency (Times/Week): 7, Weekly Exercise (Minutes/Week): 210, Intensity: Mild  Goals    . Quit smoking / using tobacco          Smoking cessation discussed      Fall Risk Fall Risk  06/09/2017 01/22/2016  Falls in the past year? No No   Depression Screen PHQ 2/9 Scores 06/09/2017  02/02/2017 01/22/2016  PHQ - 2 Score 2 2 0  PHQ- 9 Score 5 10 -     Cognitive Function     6CIT Screen 06/09/2017  What Year? 0 points  What month? 0 points  What time? 0 points  Count back from 20 0 points  Months in reverse 0 points  Repeat phrase 0 points  Total Score 0     There is no immunization history on file for this patient. Screening Tests Health Maintenance  Topic Date Due  . Hepatitis C Screening  06/15/2017 (Originally 1956-09-14)  . INFLUENZA VACCINE  08/08/2017 (Originally 07/08/2017)  . MAMMOGRAM  01/21/2018  . PAP SMEAR  01/15/2019  . COLONOSCOPY  12/09/2023  . TETANUS/TDAP  01/21/2026  . HIV Screening  Completed      Plan:  I have personally reviewed and addressed the Medicare Annual Wellness questionnaire and have noted the following in the patient's chart:  A. Medical and social history B. Use of alcohol, tobacco or illicit drugs  C. Current medications and supplements D. Functional ability and status E.  Nutritional status F.  Physical activity G. Advance directives H. List of other physicians I.  Hospitalizations, surgeries, and ER visits in previous 12 months J.  [place] such as hearing and vision if needed, cognitive and depression L. Referrals and appointments   In addition, I have reviewed and discussed with patient certain preventive protocols, quality metrics, and best practice recommendations. A written personalized care plan for preventive services as well as general preventive health recommendations were provided to patient.   Signed,  Tyler Aas, LPN Nurse Health Advisor   MD Recommendations: none

## 2017-06-09 NOTE — Patient Instructions (Signed)
Peggy Russo , Thank you for taking time to come for your Medicare Wellness Visit. I appreciate your ongoing commitment to your health goals. Please review the following plan we discussed and let me know if I can assist you in the future.   Screening recommendations/referrals: Colonoscopy: Completed 12/08/2013 Mammogram: Completed 01/22/2016 Bone Density: Due at age 60 Recommended yearly ophthalmology/optometry visit for glaucoma screening and checkup Recommended yearly dental visit for hygiene and checkup  Vaccinations: Influenza vaccine: Due 08/2017 Pneumococcal vaccine: Due at age 35 Tdap vaccine: up to date Shingles vaccine: due, check with your insurance company for coverage    Advanced directives: Advance directive discussed with you today. I have provided a copy for you to complete at home and have notarized. Once this is complete please bring a copy in to our office so we can scan it into your chart.  Conditions/risks identified: Smoking cessation discussed. Information below   Next appointment: Follow up on 06/15/2017 at 8:20am with Dr.Karamalegos. Follow up in one year for your annual wellness exam.  Preventive Care 40-64 Years, Female Preventive care refers to lifestyle choices and visits with your health care provider that can promote health and wellness. What does preventive care include?  A yearly physical exam. This is also called an annual well check.  Dental exams once or twice a year.  Routine eye exams. Ask your health care provider how often you should have your eyes checked.  Personal lifestyle choices, including:  Daily care of your teeth and gums.  Regular physical activity.  Eating a healthy diet.  Avoiding tobacco and drug use.  Limiting alcohol use.  Practicing safe sex.  Taking low-dose aspirin daily starting at age 50.  Taking vitamin and mineral supplements as recommended by your health care provider. What happens during an annual well check? The  services and screenings done by your health care provider during your annual well check will depend on your age, overall health, lifestyle risk factors, and family history of disease. Counseling  Your health care provider may ask you questions about your:  Alcohol use.  Tobacco use.  Drug use.  Emotional well-being.  Home and relationship well-being.  Sexual activity.  Eating habits.  Work and work Statistician.  Method of birth control.  Menstrual cycle.  Pregnancy history. Screening  You may have the following tests or measurements:  Height, weight, and BMI.  Blood pressure.  Lipid and cholesterol levels. These may be checked every 5 years, or more frequently if you are over 71 years old.  Skin check.  Lung cancer screening. You may have this screening every year starting at age 52 if you have a 30-pack-year history of smoking and currently smoke or have quit within the past 15 years.  Fecal occult blood test (FOBT) of the stool. You may have this test every year starting at age 11.  Flexible sigmoidoscopy or colonoscopy. You may have a sigmoidoscopy every 5 years or a colonoscopy every 10 years starting at age 27.  Hepatitis C blood test.  Hepatitis B blood test.  Sexually transmitted disease (STD) testing.  Diabetes screening. This is done by checking your blood sugar (glucose) after you have not eaten for a while (fasting). You may have this done every 1-3 years.  Mammogram. This may be done every 1-2 years. Talk to your health care provider about when you should start having regular mammograms. This may depend on whether you have a family history of breast cancer.  BRCA-related cancer screening. This may  be done if you have a family history of breast, ovarian, tubal, or peritoneal cancers.  Pelvic exam and Pap test. This may be done every 3 years starting at age 36. Starting at age 67, this may be done every 5 years if you have a Pap test in combination with  an HPV test.  Bone density scan. This is done to screen for osteoporosis. You may have this scan if you are at high risk for osteoporosis. Discuss your test results, treatment options, and if necessary, the need for more tests with your health care provider. Vaccines  Your health care provider may recommend certain vaccines, such as:  Influenza vaccine. This is recommended every year.  Tetanus, diphtheria, and acellular pertussis (Tdap, Td) vaccine. You may need a Td booster every 10 years.  Zoster vaccine. You may need this after age 17.  Pneumococcal 13-valent conjugate (PCV13) vaccine. You may need this if you have certain conditions and were not previously vaccinated.  Pneumococcal polysaccharide (PPSV23) vaccine. You may need one or two doses if you smoke cigarettes or if you have certain conditions. Talk to your health care provider about which screenings and vaccines you need and how often you need them. This information is not intended to replace advice given to you by your health care provider. Make sure you discuss any questions you have with your health care provider. Document Released: 12/21/2015 Document Revised: 08/13/2016 Document Reviewed: 09/25/2015 Elsevier Interactive Patient Education  2017 [employer] Prevention in the Home Falls can cause injuries. They can happen to people of all ages. There are many things you can do to make your home safe and to help prevent falls. What can I do on the outside of my home?  Regularly fix the edges of walkways and driveways and fix any cracks.  Remove anything that might make you trip as you walk through a door, such as a raised step or threshold.  Trim any bushes or trees on the path to your home.  Use bright outdoor lighting.  Clear any walking paths of anything that might make someone trip, such as rocks or tools.  Regularly check to see if handrails are loose or broken. Make sure that both sides of any steps  have handrails.  Any raised decks and porches should have guardrails on the edges.  Have any leaves, snow, or ice cleared regularly.  Use sand or salt on walking paths during winter.  Clean up any spills in your garage right away. This includes oil or grease spills. What can I do in the bathroom?  Use night lights.  Install grab bars by the toilet and in the tub and shower. Do not use towel bars as grab bars.  Use non-skid mats or decals in the tub or shower.  If you need to sit down in the shower, use a plastic, non-slip stool.  Keep the floor dry. Clean up any water that spills on the floor as soon as it happens.  Remove soap buildup in the tub or shower regularly.  Attach bath mats securely with double-sided non-slip rug tape.  Do not have throw rugs and other things on the floor that can make you trip. What can I do in the bedroom?  Use night lights.  Make sure that you have a light by your bed that is easy to reach.  Do not use any sheets or blankets that are too big for your bed. They should not hang down onto  the floor.  Have a firm chair that has side arms. You can use this for support while you get dressed.  Do not have throw rugs and other things on the floor that can make you trip. What can I do in the kitchen?  Clean up any spills right away.  Avoid walking on wet floors.  Keep items that you use a lot in easy-to-reach places.  If you need to reach something above you, use a strong step stool that has a grab bar.  Keep electrical cords out of the way.  Do not use floor polish or wax that makes floors slippery. If you must use wax, use non-skid floor wax.  Do not have throw rugs and other things on the floor that can make you trip. What can I do with my stairs?  Do not leave any items on the stairs.  Make sure that there are handrails on both sides of the stairs and use them. Fix handrails that are broken or loose. Make sure that handrails are as  long as the stairways.  Check any carpeting to make sure that it is firmly attached to the stairs. Fix any carpet that is loose or worn.  Avoid having throw rugs at the top or bottom of the stairs. If you do have throw rugs, attach them to the floor with carpet tape.  Make sure that you have a light switch at the top of the stairs and the bottom of the stairs. If you do not have them, ask someone to add them for you. What else can I do to help prevent falls?  Wear shoes that:  Do not have high heels.  Have rubber bottoms.  Are comfortable and fit you well.  Are closed at the toe. Do not wear sandals.  If you use a stepladder:  Make sure that it is fully opened. Do not climb a closed stepladder.  Make sure that both sides of the stepladder are locked into place.  Ask someone to hold it for you, if possible.  Clearly mark and make sure that you can see:  Any grab bars or handrails.  First and last steps.  Where the edge of each step is.  Use tools that help you move around (mobility aids) if they are needed. These include:  Canes.  Walkers.  Scooters.  Crutches.  Turn on the lights when you go into a dark area. Replace any light bulbs as soon as they burn out.  Set up your furniture so you have a clear path. Avoid moving your furniture around.  If any of your floors are uneven, fix them.  If there are any pets around you, be aware of where they are.  Review your medicines with your doctor. Some medicines can make you feel dizzy. This can increase your chance of falling. Ask your doctor what other things that you can do to help prevent falls. This information is not intended to replace advice given to you by your health care provider. Make sure you discuss any questions you have with your health care provider. Document Released: 09/20/2009 Document Revised: 05/01/2016 Document Reviewed: 12/29/2014 Elsevier Interactive Patient Education  2017 [employer].   Steps to Quit Smoking Smoking tobacco can be bad for your health. It can also affect almost every organ in your body. Smoking puts you and people around you at risk for many serious long-lasting (chronic) diseases. Quitting smoking is hard, but it is one of the best things that  you can do for your health. It is never too late to quit. What are the benefits of quitting smoking? When you quit smoking, you lower your risk for getting serious diseases and conditions. They can include:  Lung cancer or lung disease.  Heart disease.  Stroke.  Heart attack.  Not being able to have children (infertility).  Weak bones (osteoporosis) and broken bones (fractures).  If you have coughing, wheezing, and shortness of breath, those symptoms may get better when you quit. You may also get sick less often. If you are pregnant, quitting smoking can help to lower your chances of having a baby of low birth weight. What can I do to help me quit smoking? Talk with your doctor about what can help you quit smoking. Some things you can do (strategies) include:  Quitting smoking totally, instead of slowly cutting back how much you smoke over a period of time.  Going to in-person counseling. You are more likely to quit if you go to many counseling sessions.  Using resources and support systems, such as: ? Database administrator with a Social worker. ? Phone quitlines. ? Careers information officer. ? Support groups or group counseling. ? Text messaging programs. ? Mobile phone apps or applications.  Taking medicines. Some of these medicines may have nicotine in them. If you are pregnant or breastfeeding, do not take any medicines to quit smoking unless your doctor says it is okay. Talk with your doctor about counseling or other things that can help you.  Talk with your doctor about using more than one strategy at the same time, such as taking medicines while you are also going to in-person counseling. This can help  make quitting easier. What things can I do to make it easier to quit? Quitting smoking might feel very hard at first, but there is a lot that you can do to make it easier. Take these steps:  Talk to your family and friends. Ask them to support and encourage you.  Call phone quitlines, reach out to support groups, or work with a Social worker.  Ask people who smoke to not smoke around you.  Avoid places that make you want (trigger) to smoke, such as: ? Bars. ? Parties. ? Smoke-break areas at work.  Spend time with people who do not smoke.  Lower the stress in your life. Stress can make you want to smoke. Try these things to help your stress: ? Getting regular exercise. ? Deep-breathing exercises. ? Yoga. ? Meditating. ? Doing a body scan. To do this, close your eyes, focus on one area of your body at a time from head to toe, and notice which parts of your body are tense. Try to relax the muscles in those areas.  Download or buy apps on your mobile phone or tablet that can help you stick to your quit plan. There are many free apps, such as QuitGuide from the [employer] Office manager for Disease Control and Prevention). You can find more support from smokefree.gov and other websites.  This information is not intended to replace advice given to you by your health care provider. Make sure you discuss any questions you have with your health care provider. Document Released: 09/20/2009 Document Revised: 07/22/2016 Document Reviewed: 04/10/2015 Elsevier Interactive Patient Education  2018 [employer].

## 2017-06-11 ENCOUNTER — Encounter: Payer: Medicare Other | Admitting: Obstetrics and Gynecology

## 2017-06-15 ENCOUNTER — Ambulatory Visit (INDEPENDENT_AMBULATORY_CARE_PROVIDER_SITE_OTHER): Payer: Medicare Other | Admitting: Family Medicine

## 2017-06-15 ENCOUNTER — Encounter: Payer: Self-pay | Admitting: Family Medicine

## 2017-06-15 VITALS — BP 127/80 | HR 82 | Temp 98.4°F | Resp 16 | Ht 65.0 in | Wt 163.0 lb

## 2017-06-15 DIAGNOSIS — Z1159 Encounter for screening for other viral diseases: Secondary | ICD-10-CM

## 2017-06-15 DIAGNOSIS — F418 Other specified anxiety disorders: Secondary | ICD-10-CM | POA: Diagnosis not present

## 2017-06-15 DIAGNOSIS — R946 Abnormal results of thyroid function studies: Secondary | ICD-10-CM | POA: Diagnosis not present

## 2017-06-15 DIAGNOSIS — E559 Vitamin D deficiency, unspecified: Secondary | ICD-10-CM

## 2017-06-15 DIAGNOSIS — G8929 Other chronic pain: Secondary | ICD-10-CM

## 2017-06-15 DIAGNOSIS — R799 Abnormal finding of blood chemistry, unspecified: Secondary | ICD-10-CM

## 2017-06-15 DIAGNOSIS — R7989 Other specified abnormal findings of blood chemistry: Secondary | ICD-10-CM

## 2017-06-15 DIAGNOSIS — M48061 Spinal stenosis, lumbar region without neurogenic claudication: Secondary | ICD-10-CM | POA: Diagnosis not present

## 2017-06-15 DIAGNOSIS — R630 Anorexia: Secondary | ICD-10-CM

## 2017-06-15 DIAGNOSIS — E782 Mixed hyperlipidemia: Secondary | ICD-10-CM

## 2017-06-15 DIAGNOSIS — R718 Other abnormality of red blood cells: Secondary | ICD-10-CM | POA: Diagnosis not present

## 2017-06-15 DIAGNOSIS — M5441 Lumbago with sciatica, right side: Secondary | ICD-10-CM

## 2017-06-15 DIAGNOSIS — F5101 Primary insomnia: Secondary | ICD-10-CM | POA: Diagnosis not present

## 2017-06-15 DIAGNOSIS — R7309 Other abnormal glucose: Secondary | ICD-10-CM | POA: Diagnosis not present

## 2017-06-15 DIAGNOSIS — I10 Essential (primary) hypertension: Secondary | ICD-10-CM

## 2017-06-15 LAB — CBC WITH DIFFERENTIAL/PLATELET
BASOS PCT: 1 %
Basophils Absolute: 80 cells/uL (ref 0–200)
Eosinophils Absolute: 80 cells/uL (ref 15–500)
Eosinophils Relative: 1 %
HEMATOCRIT: 40.4 % (ref 35.0–45.0)
HEMOGLOBIN: 12.9 g/dL (ref 11.7–15.5)
LYMPHS ABS: 3520 {cells}/uL (ref 850–3900)
Lymphocytes Relative: 44 %
MCH: 23.5 pg — ABNORMAL LOW (ref 27.0–33.0)
MCHC: 31.9 g/dL — ABNORMAL LOW (ref 32.0–36.0)
MCV: 73.6 fL — ABNORMAL LOW (ref 80.0–100.0)
MONO ABS: 560 {cells}/uL (ref 200–950)
MPV: 11.3 fL (ref 7.5–12.5)
Monocytes Relative: 7 %
NEUTROS ABS: 3760 {cells}/uL (ref 1500–7800)
Neutrophils Relative %: 47 %
Platelets: 374 10*3/uL (ref 140–400)
RBC: 5.49 MIL/uL — AB (ref 3.80–5.10)
RDW: 17 % — ABNORMAL HIGH (ref 11.0–15.0)
WBC: 8 10*3/uL (ref 3.8–10.8)

## 2017-06-15 MED ORDER — MIRTAZAPINE 30 MG PO TABS: 30.0000 mg | ORAL_TABLET | Freq: Every day | ORAL | 2 refills | Status: DC

## 2017-06-15 MED ORDER — DULOXETINE HCL 30 MG PO CPEP: 30.0000 mg | ORAL_CAPSULE | Freq: Every day | ORAL | 2 refills | Status: DC

## 2017-06-15 NOTE — Assessment & Plan Note (Signed)
Uncertain etiology, history is non specific and does not raise any significant red flags, no abdominal pain, nausea / vomiting, dysphagia. Feels like she does get an appetite but not tolerating eating, difficult to describe. - Continue with treating underlying pain, mood first, and then also sleep - Try to gradually improve nutrition, may consider options in future with nutrition supplement boost/ensure - Consider nutritionist vs referral GI next visit if not improved may need other work-up, anticipate increase Mirtazapine from 30 to 45 also may increase appetite

## 2017-06-15 NOTE — Assessment & Plan Note (Signed)
See A&P for chronic back pain Awaiting [facility] Neurosurgery referral establish Trial on Duloxetine for chronic pain

## 2017-06-15 NOTE — Patient Instructions (Addendum)
Thank you for coming to the clinic today.  For sleep / pain - Start new Duloxetine 30mg  daily in morning for next 4-6 weeks, this may take a few weeks to have full effect, may need to increase dose next visit  Continue Mirtazapine 30mg  nightly for sleep and appetite - at your next visit will increase dose to 45mg  per night, could take 1 and half pill at that time, or will send in new rx  Still waiting to hear back from [facility] regarding Neurosurgery referral. We will check status as well.  You will be due for FASTING BLOOD WORK (no food or drink after midnight before, only water or coffee without cream/sugar on the morning of)  - Please go ahead and schedule a "Lab Only" visit in the morning at the clinic for lab draw in   TODAY   Try to wear wrist splints at night for Carpal Tunnel  Sleep Hygiene Recommendations to promote healthy sleep in all patients, especially if symptoms of insomnia are worsening. Due to the nature of sleep rhythms, if your body gets "out of rhythm", it may take some time before your sleep cycle can be "reset".  Please try to follow as many of the following tips as you can, usually there are only a few of these are the primary cause of the problem.  ?To reset your sleep rhythm, go to bed and get up at the same time every day ?Sleep only long enough to feel rested and then get out of bed ?Do not try to force yourself to sleep. If you can't sleep, get out of bed and try again later. ?Avoid naps during the day, unless excessively tired. The more sleeping during the day, then the less sleep your body needs at night.  ?Have coffee, tea, and other foods that have caffeine only in the morning ?Exercise several days a week, but not right before bed ?If you drink alcohol, prefer to have appropriate drink with one meal, but prefer to avoid alcohol in the evening, and bedtime ?If you smoke, avoid smoking, especially in the evening  ?Avoid watching TV or looking at  phones, computers, or reading devices ("e-books") that give off light at least 30 minutes before bed. This artificial light sends "awake signals" to your brain and can make it harder to fall asleep. ?Make your bedroom a comfortable place where it is easy to fall asleep: ? Put up shades or special blackout curtains to block light from outside. ? Use a white noise machine to block noise. ? Keep the temperature cool. ?Try your best to solve or at least address your problems before you go to bed ?Use relaxation techniques to manage stress. Ask your health care provider to suggest some techniques that may work well for you. These may include: ? Breathing exercises. ? Routines to release muscle tension. ? Visualizing peaceful scenes.   Please schedule a Follow-up Appointment to: No Follow-up on file.   If you have any other questions or concerns, please feel free to call the clinic or send a message through [place]. You may also schedule an earlier appointment if necessary.  Additionally, you may be receiving a survey about your experience at our clinic within a few days to 1 week by e-mail or mail. We value your feedback.  [name], DO [place]
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
## 2017-06-15 NOTE — Assessment & Plan Note (Addendum)
Persistent insomnia, uncertain underlying etiology seems to be mood also pain but not necessarily endorsing these. Improved initial on Mirtazapine for >1 year now. - Off Sertraline. History of trial on trazodone See A&P Depression  Plan: 1. Start Duloxetine 30mg  daily - may increase to 60mg  daily in 4-6 weeks if needed 2. Anticipate increase Mirtazapine from 30 to 45mg  nightly at next visit if not improved still, and will help improve appetite

## 2017-06-15 NOTE — Assessment & Plan Note (Signed)
Uncontrolled cholesterol on statin, poor lifestyle Last lipid panel 02/2016 - due today  Plan: 1. Check fasting lipid panel today 2. Continue current meds - Atorvastatin 40mg  daily 3. Continue ASA 81mg  for primary ASCVD risk reduction (off plavix) 4. Encourage improved lifestyle - low carb/cholesterol, reduce portion size, continue improving regular exercise

## 2017-06-15 NOTE — Assessment & Plan Note (Signed)
Stable, without significant worsening depression, last PHQ improved 06/09/17 at [facility], not repeated today, only on Mirtazapine. Secondary insomnia, uncertain etiology may be related to mood or other cause. Self discontinued Sertraline. Did not establish with [facility] or therapy/counseling  Plan: 1. Start new SNRI Duloxetine 30mg  daily for both mood, sleep, and chronic pain with back pain / spinal stenosis. Reviewed risks and side effects may need increase dose in future by 4-6 weeks up to 60mg  daily if helping. 2. Continue Mirtazapine 30mg  nightly given refill - advised next visit will increase up to 45mg  nightly for max dose, since helped previously. Also to help appetite. Avoid two changes at once today 3. Recommend establish with mental health therapist 4. Follow-up mood, insomnia 4 weeks - med adjust

## 2017-06-15 NOTE — Assessment & Plan Note (Signed)
Well-controlled HTN No known complications    Plan:  1. Continue current BP regimen  - Amlodipine 10mg  daily 2. Encourage improved lifestyle - low sodium diet, regular exercise 3. Start monitor BP outside office, bring readings to next visit, if persistently >140/90 or new symptoms notify office sooner 4. Follow-up as needed
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
## 2017-06-15 NOTE — Assessment & Plan Note (Signed)
Check Vitamin D with labs

## 2017-06-16 ENCOUNTER — Ambulatory Visit (INDEPENDENT_AMBULATORY_CARE_PROVIDER_SITE_OTHER): Payer: Medicare Other | Admitting: Obstetrics and Gynecology

## 2017-06-16 ENCOUNTER — Encounter: Payer: Self-pay | Admitting: Obstetrics and Gynecology

## 2017-06-16 VITALS — BP 150/81 | HR 99 | Ht 65.0 in | Wt 164.5 lb

## 2017-06-16 DIAGNOSIS — I70209 Unspecified atherosclerosis of native arteries of extremities, unspecified extremity: Secondary | ICD-10-CM | POA: Diagnosis not present

## 2017-06-16 DIAGNOSIS — N898 Other specified noninflammatory disorders of vagina: Secondary | ICD-10-CM

## 2017-06-16 DIAGNOSIS — N3289 Other specified disorders of bladder: Secondary | ICD-10-CM

## 2017-06-16 LAB — COMPLETE METABOLIC PANEL WITH GFR
ALT: 17 U/L (ref 6–29)
AST: 20 U/L (ref 10–35)
Albumin: 4.5 g/dL (ref 3.6–5.1)
Alkaline Phosphatase: 111 U/L (ref 33–130)
BUN: 13 mg/dL (ref 7–25)
CHLORIDE: 105 mmol/L (ref 98–110)
CO2: 22 mmol/L (ref 20–31)
Calcium: 9.7 mg/dL (ref 8.6–10.4)
Creat: 1.05 mg/dL (ref 0.50–1.05)
GFR, EST AFRICAN AMERICAN: 67 mL/min (ref >=60)
GFR, Est Non African American: 58 mL/min — ABNORMAL LOW (ref >=60)
Glucose, Bld: 103 mg/dL — ABNORMAL HIGH (ref 65–99)
POTASSIUM: 4 mmol/L (ref 3.5–5.3)
Sodium: 140 mmol/L (ref 135–146)
Total Bilirubin: 0.7 mg/dL (ref 0.2–1.2)
Total Protein: 7.8 g/dL (ref 6.1–8.1)

## 2017-06-16 LAB — HEPATITIS C ANTIBODY: HCV AB: NEGATIVE

## 2017-06-16 LAB — LIPID PANEL
Cholesterol: 195 mg/dL (ref <200)
HDL: 83 mg/dL (ref >50)
LDL CALC: 89 mg/dL (ref <100)
TRIGLYCERIDES: 117 mg/dL (ref <150)
Total CHOL/HDL Ratio: 2.3 Ratio (ref <5.0)
VLDL: 23 mg/dL (ref <30)

## 2017-06-16 LAB — TSH: TSH: 0.56 mIU/L

## 2017-06-16 LAB — HEMOGLOBIN A1C
Hgb A1c MFr Bld: 5.9 % — ABNORMAL HIGH (ref <5.7)
Mean Plasma Glucose: 123 mg/dL

## 2017-06-16 LAB — T4, FREE: Free T4: 1.3 ng/dL (ref 0.8–1.8)

## 2017-06-16 LAB — VITAMIN D 25 HYDROXY (VIT D DEFICIENCY, FRACTURES): Vit D, 25-Hydroxy: 31 ng/mL (ref 30–100)

## 2017-06-16 MED ORDER — AZITHROMYCIN 1 G PO PACK: 1.0000 g | PACK | Freq: Once | ORAL | 0 refills | Status: AC

## 2017-06-16 NOTE — Patient Instructions (Signed)
1. Continue Merbetriq 25 mg daily 2. Zithromax 1 g orally is prescribed for vaginal discharge 3. Wet prep is completed today for evaluation of vaginal discharge 4. Laboratory testing is obtained for STD today (GC/CT) 5. Follow-up as needed

## 2017-06-16 NOTE — Progress Notes (Signed)
Chief complaint: 1. Unstable bladder 2. Vaginal discharge  Patient presents for follow-up. She has been started on Merbetriq 25 mg a day. She has noted improvement in her urinary urgency and frequency. She is not experiencing any of the significant side effects of mouth dryness or I dryness. She does note some phlegm in the early morning hours on the daily basis. Overall she is pleased with the medication.  Patient also reports having vaginal discharge. She is not experiencing any new sexual partners. She denies fevers chills or sweats. She denies vaginal or vulvar itching.  Partner has HIV. Chronic low back pain persists.  Past medical history, past surgical history, problem list, medications, and allergies are reviewed  OBJECTIVE: BP (!) 150/81   Pulse 99   Ht 5\' 5"  (1.651 m)   Wt 164 lb 8 oz (74.6 kg)   BMI 27.37 kg/m  Pleasant well-appearing female in no acute distress. Alert and oriented. Abdomen: Soft, nontender without organomegaly Pelvic exam: External genitalia normal BUS-normal Vagina-yellow foamy discharge; no malodor Cervix-no lesions; no cervical motion tenderness Uterus-not examined Adnexa-not examined  Rectovaginal-normal external exam  PROCEDURE: Wet prep Normal saline-TNTC white blood cells; no clue cells; no Trichomonas KOH-no hyphae  ASSESSMENT: 1. Unstable bladder, improved with Merbetriq therapy 2. Leukorrhea  PLAN: 1. Wet prep 2. Nu swab plus; results will be made available to the patient 3. Zithromax 1 g by mouth 4. Return when necessary  A total of 15 minutes were spent face-to-face with the patient during this encounter and over half of that time dealt with counseling and coordination of care.  Brayton Mars, MD  Note: This dictation was prepared with Dragon dictation along with smaller phrase technology. Any transcriptional errors that result from this process are unintentional.

## 2017-06-20 LAB — NUSWAB VAGINITIS PLUS (VG+)
CANDIDA ALBICANS, NAA: NEGATIVE
CHLAMYDIA TRACHOMATIS, NAA: NEGATIVE
Candida glabrata, NAA: NEGATIVE
Neisseria gonorrhoeae, NAA: NEGATIVE
Trich vag by NAA: POSITIVE — AB

## 2017-06-22 ENCOUNTER — Telehealth: Payer: Self-pay | Admitting: Obstetrics and Gynecology

## 2017-06-22 NOTE — Telephone Encounter (Signed)
Patient called wanting to speak with Peggy Russo, She is unsure if she needs an appointment or not, Patient is having heavy discharge, and wants to speak with someone I regards to getting a prescription to help with her symptoms. Please advise.

## 2017-06-23 NOTE — Telephone Encounter (Signed)
LMTRC- see nuswab lab

## 2017-06-24 ENCOUNTER — Telehealth: Payer: Self-pay

## 2017-06-24 ENCOUNTER — Telehealth: Payer: Self-pay | Admitting: Obstetrics and Gynecology

## 2017-06-24 MED ORDER — METRONIDAZOLE 500 MG PO TABS: 500.0000 mg | ORAL_TABLET | Freq: Two times a day (BID) | ORAL | 0 refills | Status: DC

## 2017-06-24 NOTE — Telephone Encounter (Signed)
-----   Message from Brayton Mars, MD sent at 06/23/2017  8:08 AM EDT ----- Please notify - Abnormal Labs Please: Flagyl 500 mg twice a day for 7 days

## 2017-06-24 NOTE — Telephone Encounter (Signed)
Pt aware of nu swab. Med erx. Pt aware to have partners treated.

## 2017-06-24 NOTE — Telephone Encounter (Signed)
°  Patient lvm stating that she would like to speak with a nurse as soon as possible in regards to getting a medication for her thick discharge and discoloration. Patient needs the medication sent to her pharmacy. Please advise.

## 2017-06-24 NOTE — Telephone Encounter (Signed)
See nuswab lab

## 2017-06-25 DIAGNOSIS — Z5181 Encounter for therapeutic drug level monitoring: Secondary | ICD-10-CM | POA: Diagnosis not present

## 2017-07-11 ENCOUNTER — Encounter: Payer: Self-pay | Admitting: Emergency Medicine

## 2017-07-11 ENCOUNTER — Emergency Department
Admission: EM | Admit: 2017-07-11 | Discharge: 2017-07-11 | Disposition: A | Discharge status: Home / Self Care | Payer: Medicare Other | Attending: Emergency Medicine | Admitting: Emergency Medicine

## 2017-07-11 DIAGNOSIS — M5432 Sciatica, left side: Secondary | ICD-10-CM

## 2017-07-11 DIAGNOSIS — F17210 Nicotine dependence, cigarettes, uncomplicated: Secondary | ICD-10-CM | POA: Diagnosis not present

## 2017-07-11 DIAGNOSIS — M5442 Lumbago with sciatica, left side: Secondary | ICD-10-CM | POA: Insufficient documentation

## 2017-07-11 DIAGNOSIS — I10 Essential (primary) hypertension: Secondary | ICD-10-CM | POA: Diagnosis not present

## 2017-07-11 DIAGNOSIS — Z79899 Other long term (current) drug therapy: Secondary | ICD-10-CM | POA: Insufficient documentation

## 2017-07-11 MED ORDER — OXYCODONE HCL 5 MG PO TABS: 5.0000 mg | ORAL_TABLET | Freq: Three times a day (TID) | ORAL | 0 refills | Status: DC | PRN

## 2017-07-11 MED ORDER — METHYLPREDNISOLONE 4 MG PO TBPK
ORAL_TABLET | ORAL | 0 refills | Status: DC
Start: 2017-07-11 — End: 2017-08-18

## 2017-07-11 MED ORDER — ORPHENADRINE CITRATE ER 100 MG PO TB12
100.0000 mg | ORAL_TABLET | Freq: Two times a day (BID) | ORAL | Status: DC
  Filled 2017-07-11: qty 1

## 2017-07-11 MED ORDER — KETOROLAC TROMETHAMINE 60 MG/2ML IM SOLN
60.0000 mg | Freq: Once | INTRAMUSCULAR | Status: AC
  Administered 2017-07-11: 60 mg via INTRAMUSCULAR
  Filled 2017-07-11: qty 2

## 2017-07-11 MED ORDER — DEXAMETHASONE SODIUM PHOSPHATE 10 MG/ML IJ SOLN
10.0000 mg | Freq: Once | INTRAMUSCULAR | Status: AC
  Administered 2017-07-11: 10 mg via INTRAMUSCULAR
  Filled 2017-07-11: qty 1

## 2017-07-11 MED ORDER — CYCLOBENZAPRINE HCL 10 MG PO TABS: 10.0000 mg | ORAL_TABLET | Freq: Three times a day (TID) | ORAL | 0 refills | Status: DC | PRN

## 2017-07-11 NOTE — ED Provider Notes (Signed)
[facility] Emergency Department Provider Note  ` ____________________________________________   None    (approximate)  I have reviewed the triage vital signs and the nursing notes.   HISTORY  Chief Complaint Back Pain and Anorexia    HPI Peggy Russo is a 61 y.o. female patient complaining of sciatica to the left lower extremity. Patient has a history of chronic back pain secondary tochronic with degenerative disc disease of the lumbar spine. Patient denies bladder or bowel dysfunction. Patient rates the pain as a 10 over 10. No palliative measures for complaint.   Past Medical History:  Diagnosis Date  . Abnormal Pap smear of cervix 07/2015   needed Leep- did not get one  . Anxiety   . Depression   . GERD (gastroesophageal reflux disease)   . High cholesterol   . Hypertension     Patient Active Problem List   Diagnosis Date Noted  . Poor appetite 06/15/2017  . At risk for sexually transmitted disease due to partner with HIV 03/13/2017  . Degenerative lumbar spinal stenosis 03/13/2017  . Recurrent UTI (urinary tract infection) 02/12/2017  . Hyperlipidemia 02/02/2017  . Pre-diabetes 02/02/2017  . Chronic low back pain 12/10/2016  . Unstable bladder 12/10/2016  . Chronic constipation 02/26/2016  . Vitamin D deficiency 01/22/2016  . Insomnia 01/22/2016  . Arteriosclerosis of artery of extremity ([place]) 01/21/2016  . Nocturia 01/21/2016  . Extremity pain 09/02/2012  . Depression with anxiety 08/17/2012  . Acid reflux 08/17/2012  . Abdominal pain, generalized 12/08/1898  . Cyst and pseudocyst of pancreas 12/08/1898  . Benign essential HTN 12/08/1898  . Geniculate herpes zoster 12/08/1898    Past Surgical History:  Procedure Laterality Date  . CESAREAN SECTION    . PANCREAS BIOPSY     Abnormality, resulted to be non malignant  . SPLENECTOMY, TOTAL     Removal of spleen in order to obtain pancreatic biopsy  . stent leg    . TUBAL  LIGATION      Prior to Admission medications   Medication Sig Start Date End Date Taking? Authorizing Provider  amLODipine (NORVASC) 10 MG tablet Take 1 tablet (10 mg total) by mouth daily. 02/02/17   Karamalegos, Devonne Doughty, DO  aspirin EC 81 MG tablet Take by mouth.    [provider]  atorvastatin (LIPITOR) 40 MG tablet Take 1 tablet (40 mg total) by mouth daily. 02/02/17   Karamalegos, Devonne Doughty, DO  cyclobenzaprine (FLEXERIL) 10 MG tablet Take 1 tablet (10 mg total) by mouth 3 (three) times daily as needed. 07/11/17   Sable Feil, PA-C  DULoxetine (CYMBALTA) 30 MG capsule Take 1 capsule (30 mg total) by mouth daily. 06/15/17   Karamalegos, Devonne Doughty, DO  methylPREDNISolone (MEDROL DOSEPAK) 4 MG TBPK tablet Take Tapered dose as directed starting tomorrow morning. 07/11/17   Sable Feil, PA-C  metroNIDAZOLE (FLAGYL) 500 MG tablet Take 1 tablet (500 mg total) by mouth 2 (two) times daily. 06/24/17   Defrancesco, Alanda Slim, MD  mirabegron ER (MYRBETRIQ) 50 MG TB24 tablet Take 1 tablet (50 mg total) by mouth daily. 04/20/17   Bjorn Loser, MD  mirtazapine (REMERON) 30 MG tablet Take 1 tablet (30 mg total) by mouth at bedtime. 06/15/17   Karamalegos, Devonne Doughty, DO  Multiple Vitamins-Minerals (ONE-A-DAY WOMENS 50+ ADVANTAGE) TABS Take by mouth.    [provider]  omeprazole (PRILOSEC) 20 MG capsule Take 1 capsule (20 mg total) by mouth daily before breakfast. 02/02/17  Karamalegos, Alexander J, DO  oxyCODONE (ROXICODONE) 5 MG immediate release tablet Take 1 tablet (5 mg total) by mouth every 8 (eight) hours as needed. 07/11/17 07/11/18  Sable Feil, PA-C    Allergies Acetaminophen and Hydromorphone  Family History  Problem Relation Age of Onset  . Hypertension Mother   . Cancer Neg Hx   . Diabetes Neg Hx   . Ovarian cancer Neg Hx   . Heart disease Neg Hx     Social History Social History  Substance Use Topics  . Smoking status: Current Every Day Smoker     Packs/day: 1.00    Years: 25.00    Types: Cigarettes  . Smokeless tobacco: Current User  . Alcohol use No    Review of Systems  Constitutional: No fever/chills Eyes: No visual changes. ENT: No sore throat. Cardiovascular: Denies chest pain. Respiratory: Denies shortness of breath. Gastrointestinal: No abdominal pain.  No nausea, no vomiting.  No diarrhea.  No constipation. Genitourinary: Negative for dysuria. Musculoskeletal: Chronic back pain Skin: Negative for rash. Neurological: Negative for headaches, focal weakness or numbness. Psychiatric:Anxiety and depression Endocrine:Hyperlipidemia Hematological/Lymphatic: Allergic/Immunilogical: Tylenol and Dilaudid ____________________________________________   PHYSICAL EXAM:  VITAL SIGNS: ED Triage Vitals  Enc Vitals Group     BP 07/11/17 1643 (!) 152/95     Pulse Rate 07/11/17 1643 87     Resp 07/11/17 1643 18     Temp 07/11/17 1643 98.7 F (37.1 C)     Temp src --      SpO2 07/11/17 1643 99 %     Weight 07/11/17 1643 165 lb (74.8 kg)     Height 07/11/17 1643 5\' 5"  (1.651 m)     Head Circumference --      Peak Flow --      Pain Score 07/11/17 1642 10     Pain Loc --      Pain Edu? --      Excl. in [place]? --     Constitutional: Alert and oriented. Well appearing and in no acute distress. Cardiovascular: Normal rate, regular rhythm. Grossly normal heart sounds.  Good peripheral circulation. Respiratory: Normal respiratory effort.  No retractions. Lungs CTAB. Gastrointestinal: Soft and nontender. No distention. No abdominal bruits. No CVA tenderness. Musculoskeletal: No obvious spinal deformity. Patient is moderate guarding palpation L4-S1. Positive left straight leg test.  Neurologic:  Normal speech and language. No gross focal neurologic deficits are appreciated. No gait instability. Skin:  Skin is warm, dry and intact. No rash noted. Psychiatric: Mood and affect are normal. Speech and behavior are  normal.  ____________________________________________   LABS (all labs ordered are listed, but only abnormal results are displayed)  Labs Reviewed - No data to display ____________________________________________  EKG   ____________________________________________  RADIOLOGY  No results found.  ____________________________________________   PROCEDURES  Procedure(s) performed: None  Procedures  Critical Care performed: No  ____________________________________________   INITIAL IMPRESSION / ASSESSMENT AND PLAN / ED COURSE  Pertinent labs & imaging results that were available during my care of the patient were reviewed by me and considered in my medical decision making (see chart for details).  Chronic low back pain with sciatica. Patient given discharge Instructions. Patient advised follow-up with PCP to consider consult to neurology.      ____________________________________________   FINAL CLINICAL IMPRESSION(S) / ED DIAGNOSES  Final diagnoses:  Sciatica of left side      NEW MEDICATIONS STARTED DURING THIS VISIT:  New Prescriptions   CYCLOBENZAPRINE (FLEXERIL) 10 MG  TABLET    Take 1 tablet (10 mg total) by mouth 3 (three) times daily as needed.   METHYLPREDNISOLONE (MEDROL DOSEPAK) 4 MG TBPK TABLET    Take Tapered dose as directed starting tomorrow morning.   OXYCODONE (ROXICODONE) 5 MG IMMEDIATE RELEASE TABLET    Take 1 tablet (5 mg total) by mouth every 8 (eight) hours as needed.     Note:  This document was prepared using Dragon voice recognition software and may include unintentional dictation errors.    Sable Feil, PA-C 07/11/17 Argentina Ponder, MD 07/11/17 [phone]

## 2017-07-11 NOTE — ED Notes (Signed)
Called pharmacy for med

## 2017-07-11 NOTE — ED Triage Notes (Signed)
C/o sciatica pain, burping with eating and loss of appetite

## 2017-07-11 NOTE — ED Notes (Signed)
Has dr appt on Monday but states couldn't wait

## 2017-07-13 ENCOUNTER — Ambulatory Visit (INDEPENDENT_AMBULATORY_CARE_PROVIDER_SITE_OTHER): Payer: Medicare Other | Admitting: Family Medicine

## 2017-07-13 ENCOUNTER — Encounter: Payer: Self-pay | Admitting: Family Medicine

## 2017-07-13 VITALS — BP 138/68 | HR 84 | Temp 97.9°F | Resp 16 | Ht 65.0 in | Wt 168.0 lb

## 2017-07-13 DIAGNOSIS — R630 Anorexia: Secondary | ICD-10-CM

## 2017-07-13 DIAGNOSIS — F418 Other specified anxiety disorders: Secondary | ICD-10-CM | POA: Diagnosis not present

## 2017-07-13 DIAGNOSIS — R110 Nausea: Secondary | ICD-10-CM

## 2017-07-13 DIAGNOSIS — M5441 Lumbago with sciatica, right side: Secondary | ICD-10-CM | POA: Diagnosis not present

## 2017-07-13 DIAGNOSIS — F5101 Primary insomnia: Secondary | ICD-10-CM | POA: Diagnosis not present

## 2017-07-13 DIAGNOSIS — G8929 Other chronic pain: Secondary | ICD-10-CM

## 2017-07-13 DIAGNOSIS — Z5181 Encounter for therapeutic drug level monitoring: Secondary | ICD-10-CM | POA: Diagnosis not present

## 2017-07-13 MED ORDER — DULOXETINE HCL 60 MG PO CPEP: 60.0000 mg | ORAL_CAPSULE | Freq: Every day | ORAL | 5 refills | Status: DC

## 2017-07-13 MED ORDER — ZOLPIDEM TARTRATE 5 MG PO TABS: 5.0000 mg | ORAL_TABLET | Freq: Every evening | ORAL | 1 refills | Status: DC | PRN

## 2017-07-13 MED ORDER — ONDANSETRON 4 MG PO TBDP: 4.0000 mg | ORAL_TABLET | Freq: Three times a day (TID) | ORAL | 1 refills | Status: DC | PRN

## 2017-07-13 NOTE — Progress Notes (Signed)
Subjective:    Patient ID: Peggy Russo, female    DOB: [birth year], 61 y.o.   MRN: [id number]  Peggy Russo is a 61 y.o. female presenting on 07/13/2017 for Insomnia (follow up meds not helping)   HPI   Depression / Insomnia / Reduced Appetite - Last visit with me 06/15/17, for follow-up of same problems, treated with new start Duloxetine 30mg , and given instructions on titrating up Mirtazapine for insomnia/appetite, see prior notes for background information. - Interval update with continues to take Duloxetine 30mg  daily without any improvement, has self titrated up Mirtazapine from 30mg  nightly to 2 whole tabs for 60mg  nightly, also has had wt gain of 4 lbs but still concern with poor appetite - Today patient reports concern with insomnia still, states very hard time sleeping , difficulty attributing cause, but does feel like pain is primary factor keeping her awake. Has failed Trazodone, Celexa, now Mirtazapine. Has not been on Ambien or other sleeping medicine - Not established with Psychiatry or Psychology therapy, was given resource for [facility] - Regarding appetite, describe often will feel nauseas in AM and feel hungry but when tries to eat does not have appetite and not interested in eating, will not have problems with vomiting - Admits nausea in AM - Denies dysphagia, vomiting, abdominal pain, dark stools, suicidal or homicidal ideation, anxiety, panic attacks, urinary frequency, unintentional weight loss  Chronic Bilateral Low Back Pain / R Sciatica: - Last visit with me 06/15/17, for same problem started on Duloxetine 30mg  daily - see above. Also recent visit [facility] ED 07/11/17 for LBP treated with steroid medrol dose pack and Oxycodone, recommended neurology referral, however since 05/2017 referral was placed for [facility], awaiting new patient appointment - Today she reports that she is scheduled to see Neurosurgery Spine specialist on 07/28/17 with new apt - No new  concerns or symptoms, back pain is consistent  Social History  Substance Use Topics  . Smoking status: Current Every Day Smoker    Packs/day: 1.00    Years: 25.00    Types: Cigarettes  . Smokeless tobacco: Current User  . Alcohol use No    Review of Systems Per HPI unless specifically indicated above     Objective:    BP 138/68   Pulse 84   Temp 97.9 F (36.6 C) (Oral)   Resp 16   Ht 5\' 5"  (1.651 m)   Wt 168 lb (76.2 kg)   BMI 27.96 kg/m   Wt Readings from Last 3 Encounters:  07/13/17 168 lb (76.2 kg)  07/11/17 165 lb (74.8 kg)  06/16/17 164 lb 8 oz (74.6 kg)    Physical Exam  Constitutional: She is oriented to person, place, and time. She appears well-developed and well-nourished. No distress.  Well-appearing, comfortable, cooperative  HENT:  Head: Normocephalic and atraumatic.  Mouth/Throat: Oropharynx is clear and moist.  Eyes: Conjunctivae are normal. Right eye exhibits no discharge. Left eye exhibits no discharge.  Neck: Normal range of motion. Neck supple. No thyromegaly present.  Cardiovascular: Normal rate, regular rhythm, normal heart sounds and intact distal pulses.   No murmur heard. Pulmonary/Chest: Effort normal and breath sounds normal. No respiratory distress. She has no wheezes. She has no rales.  Musculoskeletal: Normal range of motion. She exhibits no edema.  Lymphadenopathy:    She has no cervical adenopathy.  Neurological: She is alert and oriented to person, place, and time.  Skin: Skin is warm and dry. No rash noted. She  is not diaphoretic. No erythema.  Psychiatric: She has a normal mood and affect. Her behavior is normal.  Well groomed, good eye contact, normal speech and thoughts  Nursing note and vitals reviewed.    Results for orders placed or performed in visit on 06/16/17  NuSwab Vaginitis Plus (VG+)  Result Value Ref Range   Atopobium vaginae Moderate - 1 Score   BVAB 2 Low - 0 Score   Megasphaera 1 Low - 0 Score   Candida  albicans, NAA Negative Negative   Candida glabrata, NAA Negative Negative   Trich vag by NAA Positive (A) Negative   Chlamydia trachomatis, NAA Negative Negative   Neisseria gonorrhoeae, NAA Negative Negative      Assessment & Plan:   Problem List Items Addressed This Visit    Poor appetite    Still uncertain etiology, history is non specific and does not raise any significant red flags, no abdominal pain, vomiting, dysphagia. Feels like she does get an appetite but not tolerating eating, difficult to describe. - Reassuring with wt gain 4 lbs in >4-6 weeks - Suspected may be secondary to chronic pain with some nausea - Failed Mirtazapine and SSRI/SNRI meds  Plan: 1. Trial on Zofran ODT PRN nausea 2. Increase duloxetine from 30 to 60mg  daily 3. Taper off Mirtazapine as advised 4. She is not adhering to nutritional supplement 5. Since she is scheduled to see Neurosurgery soon for back pain in 07/28/17, will defer further management of appetite at this time, otherwise in future if still uncontrolled, consider nutritionist vs referral GI next visit if not improved may need other work-up      Relevant Medications   ondansetron (ZOFRAN ODT) 4 MG disintegrating tablet   Insomnia - Primary    Persistent insomnia, suspect most likely related to chronic back pain, keeping her awake at night, difficulty in describing symptoms per patient, and she has some inaccurate expectations of medicine to completely resolve sleeping problem - Failed Sertraline, Celexa, Mirtazapine, Trazodone See A&P Depression  Plan: 1. Increase Duloxetine from 30 to 60mg  daily 2. Taper off Mirtazapine since not effective 3. Rx new trial on Ambien 5mg  QHS PRN - #20 +1 refill, precautions reviewed sedation and risk of falls, grogginess, do not take nightly only PRN - do not plan to rx this long-term, anticipate only short-term try to re-set sleep cycle and see if pain improved then maybe insomnia is better. 4. Follow-up in  future if still need Ambien consider printed [facility] Controlled Contract if need      Relevant Medications   zolpidem (AMBIEN) 5 MG tablet   DULoxetine (CYMBALTA) 60 MG capsule   Depression with anxiety    Stable, without significant worsening depression, last PHQ improved [facility]/09/17 at [facility] Secondary insomnia, uncertain etiology may be related to mood or other cause, likely chronic back pain as primary factor Failed Sertarline, Celexa, Trazodone, Mirtazapine Did not establish with [facility] or therapy/counseling  Plan: 1. Increase Duloxetine 30 to 60mg  daily, may help pain, mood, and insomnia 2. Taper off Mirtazapine since ineffective, instructions per AVS 3. Trial on Ambien PRN for insomnia, short-term only 4. Recommend establish with mental health therapist 5. Follow-up 3 months after seen by Neurosurgery, if pain improved in future anticipate mood/insomnia would improve      Relevant Medications   zolpidem (AMBIEN) 5 MG tablet   DULoxetine (CYMBALTA) 60 MG capsule   Chronic low back pain    Stable, chronic problem No changes now, recently seen ED 8/4, currently  on medrol dose pak and has PRN oxycodone very short term supply from ED  Increase Duloxetine 30 to 60 today Follow-up as scheduled new patient apt with [facility] Neurosurgery 07/28/17      Relevant Medications   DULoxetine (CYMBALTA) 60 MG capsule    Other Visit Diagnoses    Nausea       Trial on Zofran ODT PRN to see if improved nausea can lead to improved appetite   Relevant Medications   ondansetron (ZOFRAN ODT) 4 MG disintegrating tablet      Meds ordered this encounter  Medications  . DISCONTD: MYRBETRIQ 25 MG TB24 tablet    Refill:  6  . ondansetron (ZOFRAN ODT) 4 MG disintegrating tablet    Sig: Take 1 tablet (4 mg total) by mouth every 8 (eight) hours as needed for nausea or vomiting.    Dispense:  30 tablet    Refill:  1  . zolpidem (AMBIEN) 5 MG tablet    Sig: Take 1 tablet (5 mg total) by mouth at  bedtime as needed for sleep.    Dispense:  20 tablet    Refill:  1  . DULoxetine (CYMBALTA) 60 MG capsule    Sig: Take 1 capsule (60 mg total) by mouth daily.    Dispense:  30 capsule    Refill:  5      Follow up plan: Return in about 3 months (around 10/13/2017) for Insomnia / Reduced Appetite / Back Pain.  [name], [facility] 07/13/2017, 1:22 PM

## 2017-07-13 NOTE — Assessment & Plan Note (Signed)
Stable, chronic problem No changes now, recently seen ED 8/4, currently on medrol dose pak and has PRN oxycodone very short term supply from ED  Increase Duloxetine 30 to 60 today Follow-up as scheduled new patient apt with [facility] Neurosurgery 07/28/17

## 2017-07-13 NOTE — Assessment & Plan Note (Signed)
Still uncertain etiology, history is non specific and does not raise any significant red flags, no abdominal pain, vomiting, dysphagia. Feels like she does get an appetite but not tolerating eating, difficult to describe. - Reassuring with wt gain 4 lbs in >4-6 weeks - Suspected may be secondary to chronic pain with some nausea - Failed Mirtazapine and SSRI/SNRI meds  Plan: 1. Trial on Zofran ODT PRN nausea 2. Increase duloxetine from 30 to 60mg  daily 3. Taper off Mirtazapine as advised 4. She is not adhering to nutritional supplement 5. Since she is scheduled to see Neurosurgery soon for back pain in 07/28/17, will defer further management of appetite at this time, otherwise in future if still uncontrolled, consider nutritionist vs referral GI next visit if not improved may need other work-up

## 2017-07-13 NOTE — Assessment & Plan Note (Addendum)
Persistent insomnia, suspect most likely related to chronic back pain, keeping her awake at night, difficulty in describing symptoms per patient, and she has some inaccurate expectations of medicine to completely resolve sleeping problem - Failed Sertraline, Celexa, Mirtazapine, Trazodone See A&P Depression  Plan: 1. Increase Duloxetine from 30 to 60mg  daily 2. Taper off Mirtazapine since not effective 3. Rx new trial on Ambien 5mg  QHS PRN - #20 +1 refill, precautions reviewed sedation and risk of falls, grogginess, do not take nightly only PRN - do not plan to rx this long-term, anticipate only short-term try to re-set sleep cycle and see if pain improved then maybe insomnia is better. 4. Follow-up in future if still need Ambien consider printed [facility] Controlled Contract if need

## 2017-07-13 NOTE — Patient Instructions (Addendum)
Thank you for coming to the clinic today.  1. Taper off Mirtazapine 30mg  - take every other night for about 1 week, then cut tablet in half for 15mg  every other night for 3-5 days then STOP.  Increase Duloxetine from 30mg  to 60mg  - take ONE whole new capsule (new rx sent to pharmacy) daily, same as before it may take up to 4-6 weeks for full effect.  Start Ambien 5mg  at night as needed, #20 pills and 1 refill. Be careful with sedation the next day.  Follow-up with Neurosurgery [facility] as scheduled 07/28/17  Please schedule a Follow-up Appointment to: Return in about 3 months (around 10/13/2017) for Insomnia / Reduced Appetite / Back Pain.  If you have any other questions or concerns, please feel free to call the clinic or send a message through [place]. You may also schedule an earlier appointment if necessary.  Additionally, you may be receiving a survey about your experience at our clinic within a few days to 1 week by e-mail or mail. We value your feedback.  [name], DO [place]

## 2017-07-13 NOTE — Assessment & Plan Note (Signed)
Stable, without significant worsening depression, last PHQ improved 06/09/17 at [facility] Secondary insomnia, uncertain etiology may be related to mood or other cause, likely chronic back pain as primary factor Failed Sertarline, Celexa, Trazodone, Mirtazapine Did not establish with [facility] or therapy/counseling  Plan: 1. Increase Duloxetine 30 to 60mg  daily, may help pain, mood, and insomnia 2. Taper off Mirtazapine since ineffective, instructions per AVS 3. Trial on Ambien PRN for insomnia, short-term only 4. Recommend establish with mental health therapist 5. Follow-up 3 months after seen by Neurosurgery, if pain improved in future anticipate mood/insomnia would improve

## 2017-07-15 DIAGNOSIS — Z5181 Encounter for therapeutic drug level monitoring: Secondary | ICD-10-CM | POA: Diagnosis not present

## 2017-07-21 IMAGING — DX DG LUMBAR SPINE COMPLETE 4+V
5 series · 5 of 5 positions shown · non-contrast
Comparison: 02/26/2016 .

CLINICAL DATA: Back pain.  Radiculopathy.  No injury.

EXAM:
LUMBAR SPINE - COMPLETE 4+ VIEW

[l-spine ap]
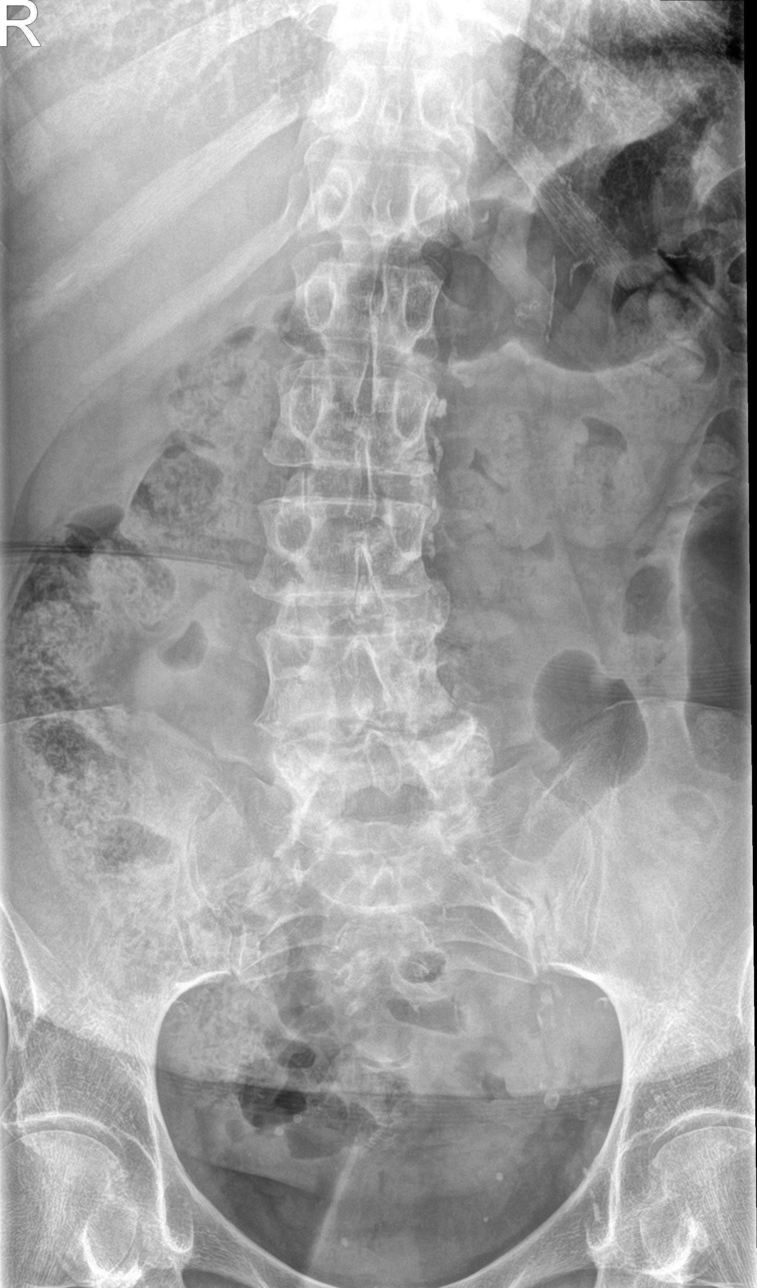

[l-spine obl (1 of 2)]
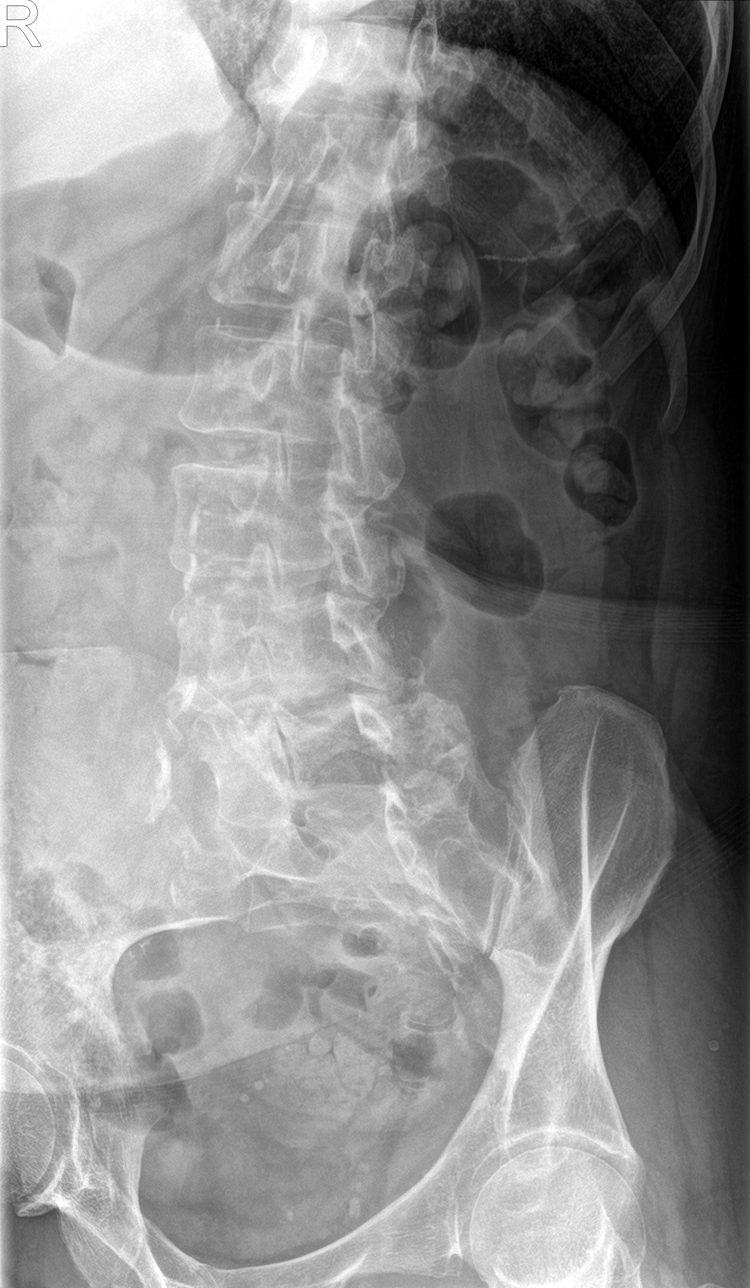

[l-spine obl (2 of 2)]
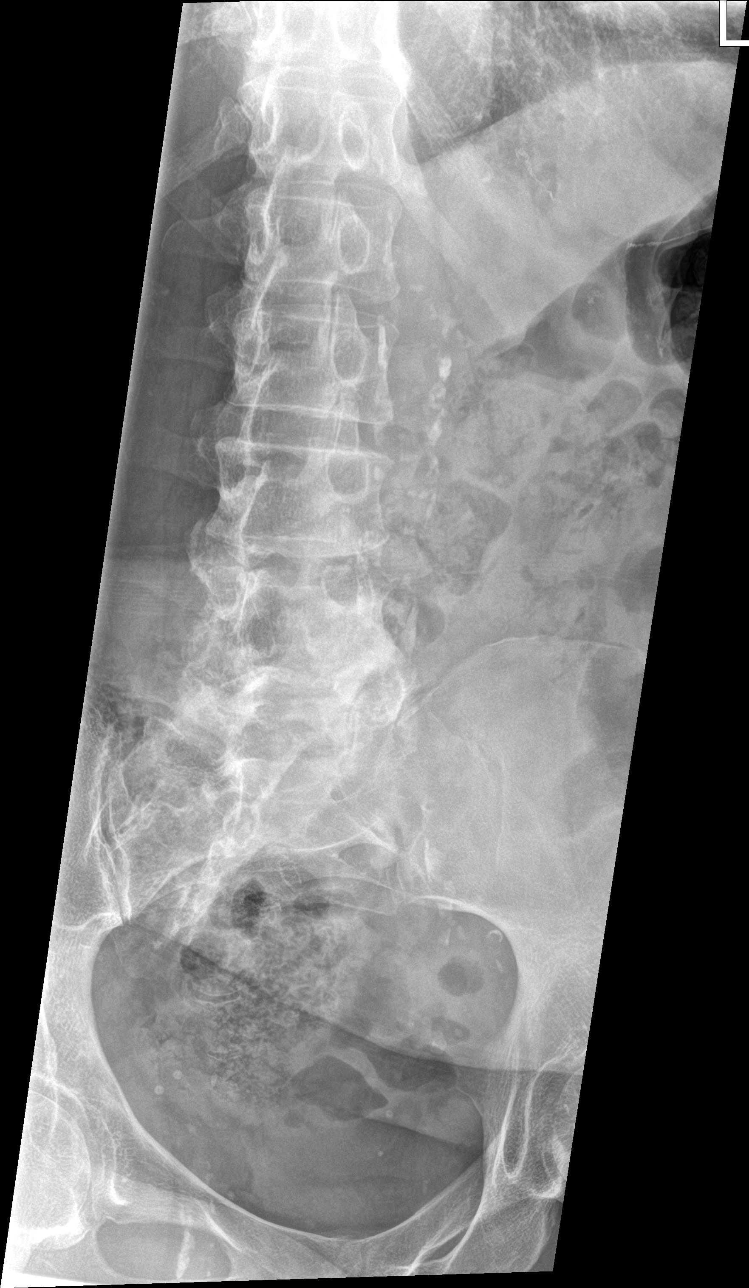

[l-spine lat]
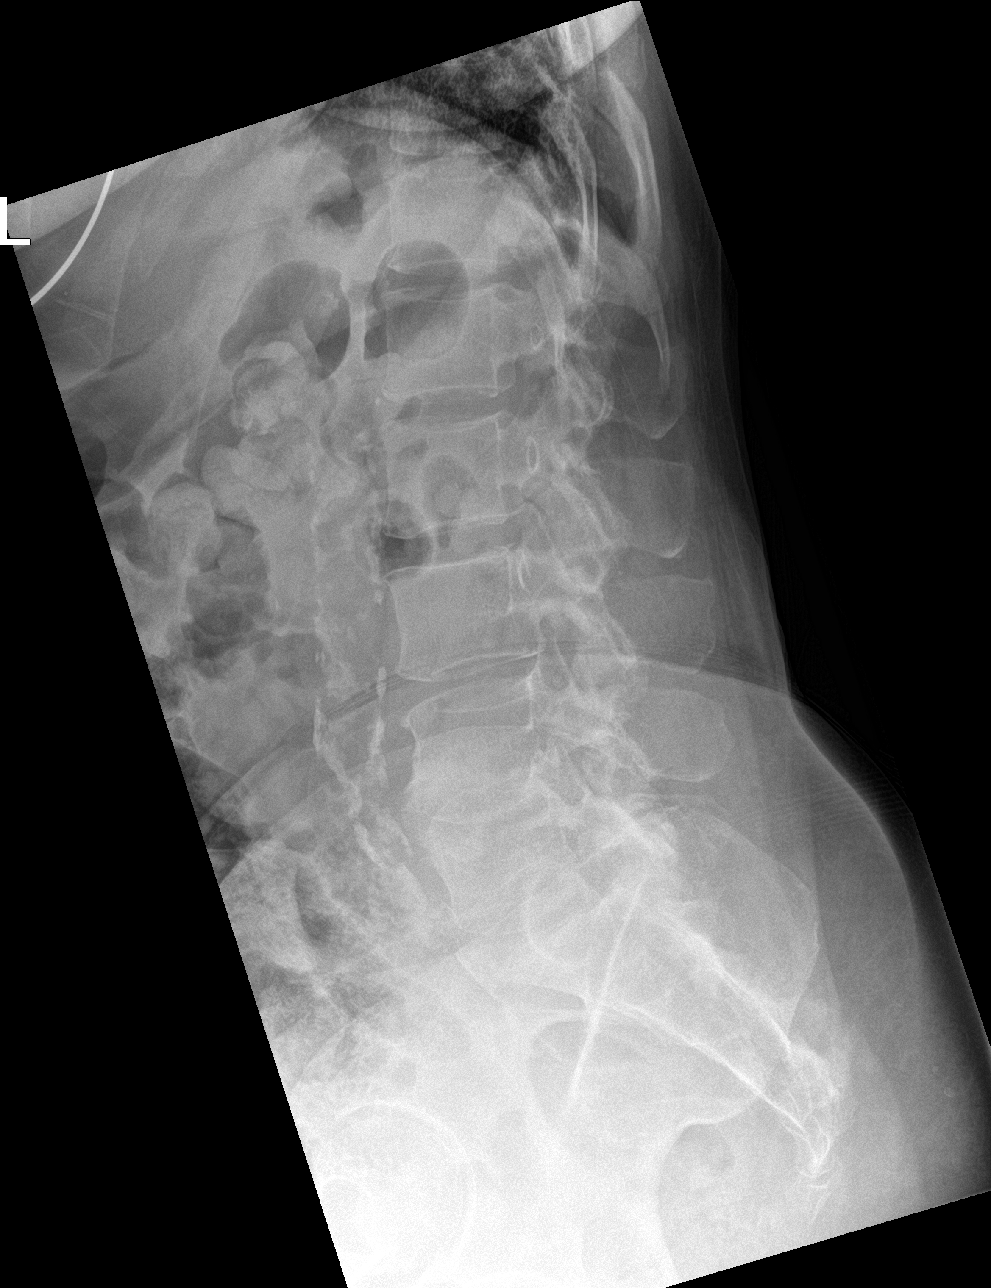

[l-spine spot]
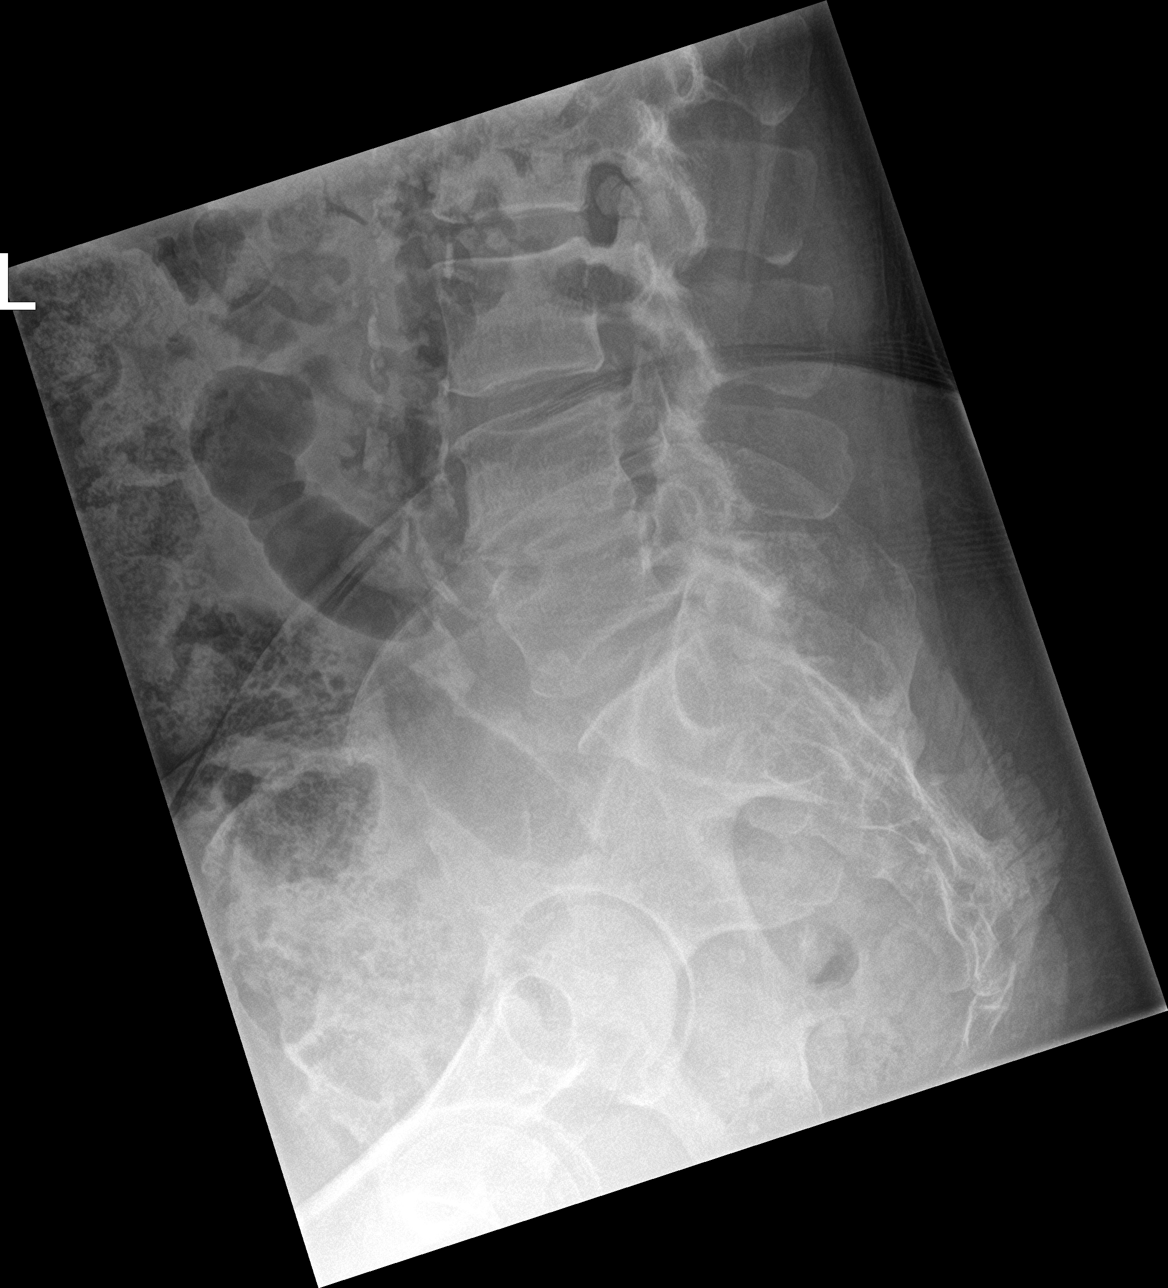

[5 of 5 positions shown; findings below may reference images not displayed]

FINDINGS: Mild scoliosis concave left. Diffuse multilevel degenerative change
particular prominent L4-L5 and L5-S1 again noted. Minimal stable
compression of L5. Aortoiliac and this atherosclerotic vascular
calcification. Pelvic calcifications consistent phleboliths.
IMPRESSION: 1. Lumbar scoliosis concave left. Diffuse degenerative change,
particularly prominent L4-L5 and L5-S1 again noted. Minimal state
compression of L5 noted.

2. Aortoiliac and visceral atherosclerotic vascular disease.

## 2017-07-25 DIAGNOSIS — M545 Low back pain: Secondary | ICD-10-CM | POA: Diagnosis not present

## 2017-07-25 DIAGNOSIS — Z5321 Procedure and treatment not carried out due to patient leaving prior to being seen by health care provider: Secondary | ICD-10-CM | POA: Insufficient documentation

## 2017-07-25 MED ORDER — IBUPROFEN 800 MG PO TABS
ORAL_TABLET | ORAL | Status: AC
  Filled 2017-07-25: qty 1

## 2017-07-25 MED ORDER — IBUPROFEN 800 MG PO TABS
800.0000 mg | ORAL_TABLET | Freq: Once | ORAL | Status: AC
  Administered 2017-07-25: 800 mg via ORAL

## 2017-07-25 NOTE — ED Triage Notes (Signed)
Patient reports lower back pain "for a long time".  Reports told on a visit to the ED that she has a slipped disc.

## 2017-07-26 ENCOUNTER — Emergency Department
Admission: EM | Admit: 2017-07-26 | Discharge: 2017-07-26 | Disposition: A | Discharge status: Home / Self Care | Payer: Medicare Other | Attending: Emergency Medicine | Admitting: Emergency Medicine

## 2017-07-28 ENCOUNTER — Other Ambulatory Visit: Payer: Self-pay | Admitting: Family Medicine

## 2017-07-28 ENCOUNTER — Telehealth: Payer: Self-pay | Admitting: Family Medicine

## 2017-07-28 DIAGNOSIS — G8929 Other chronic pain: Secondary | ICD-10-CM

## 2017-07-28 DIAGNOSIS — M5441 Lumbago with sciatica, right side: Principal | ICD-10-CM

## 2017-07-28 DIAGNOSIS — M5416 Radiculopathy, lumbar region: Secondary | ICD-10-CM | POA: Diagnosis not present

## 2017-07-28 MED ORDER — TIZANIDINE HCL 4 MG PO TABS: 4.0000 mg | ORAL_TABLET | Freq: Three times a day (TID) | ORAL | 2 refills | Status: DC | PRN

## 2017-07-28 MED ORDER — MELOXICAM 15 MG PO TABS: 15.0000 mg | ORAL_TABLET | Freq: Every day | ORAL | 2 refills | Status: DC

## 2017-07-28 NOTE — Telephone Encounter (Signed)
Margaret asked if a referral could be sent to [facility].  Her call back number is [phone]

## 2017-07-28 NOTE — Telephone Encounter (Signed)
Patient's daughter, Peggy Russo called today with concerns about her mother's chronic back pain, also with persistent insomnia and poor appetite, we have followed her for these issues for a while now and have made several med changes, she is aware that we do not do chronic pain management with opiates here, and she has since been referred to [facility] Neurosurgery for further management of her chronic back pain, she was seen today 8/21, they had requested records again which were faxed again today so they should have them now. They did not prescribe any pain medications. She is asking about what can be prescribed now for her. She is scheduled for a back epidural spinal injection in 2-3 weeks. She was asking about switching muscle relaxant. She was told to not take anymore prednisone before the epidural injection (last course prednisone was since 07/11/17), she was seen in ED 07/11/17 given prednisone dose pak and also short course Oxycodone 5mg , supposedly the prednisone helped but the oxycodone did not. I agreed to switch Flexeril to Tizanidine to try to see if this helps control symptoms better, also added Meloxicam for anti-inflammatory.  Also she went to [facility] ED on 8/19 but left before triage completed.  I advised her that unfortunately these chronic problems take a while to improve, and I agree with the current plan of Neurosurgery to do [facility] as next option, I do not think chronic opiates are ideal management for her and also she has demonstrated that they did not help as much last time given from ED.  I have limited other options to offer at this time, I expressed this to her and she understands.  Follow-up as scheduled for now, if not improving sooner we can consider other referrals if need.  Peggy Russo, [facility] [place] [facility] 07/28/2017, 2:51 PM

## 2017-07-28 NOTE — Telephone Encounter (Signed)
Placed order for Peggy Russo to be referred to Dr Iris Pert Chiropractor, spoke with [facility] staff, they will fax order tomorrow 07/29/17. Patient's daughter, [name] who called was already referred to them recently but fax was not sent until today upon request.  [name], [place] [facility] 07/28/2017, 5:04 PM

## 2017-07-29 NOTE — Telephone Encounter (Signed)
Faxed paperwork 

## 2017-08-03 DIAGNOSIS — M5416 Radiculopathy, lumbar region: Secondary | ICD-10-CM | POA: Diagnosis not present

## 2017-08-03 DIAGNOSIS — M9905 Segmental and somatic dysfunction of pelvic region: Secondary | ICD-10-CM | POA: Diagnosis not present

## 2017-08-03 DIAGNOSIS — M5431 Sciatica, right side: Secondary | ICD-10-CM | POA: Diagnosis not present

## 2017-08-03 DIAGNOSIS — M9903 Segmental and somatic dysfunction of lumbar region: Secondary | ICD-10-CM | POA: Diagnosis not present

## 2017-08-04 DIAGNOSIS — H2513 Age-related nuclear cataract, bilateral: Secondary | ICD-10-CM | POA: Diagnosis not present

## 2017-08-05 DIAGNOSIS — M5416 Radiculopathy, lumbar region: Secondary | ICD-10-CM | POA: Diagnosis not present

## 2017-08-05 DIAGNOSIS — M9903 Segmental and somatic dysfunction of lumbar region: Secondary | ICD-10-CM | POA: Diagnosis not present

## 2017-08-05 DIAGNOSIS — M9905 Segmental and somatic dysfunction of pelvic region: Secondary | ICD-10-CM | POA: Diagnosis not present

## 2017-08-05 DIAGNOSIS — M5431 Sciatica, right side: Secondary | ICD-10-CM | POA: Diagnosis not present

## 2017-08-07 DIAGNOSIS — M5416 Radiculopathy, lumbar region: Secondary | ICD-10-CM | POA: Diagnosis not present

## 2017-08-07 DIAGNOSIS — M9905 Segmental and somatic dysfunction of pelvic region: Secondary | ICD-10-CM | POA: Diagnosis not present

## 2017-08-07 DIAGNOSIS — M9903 Segmental and somatic dysfunction of lumbar region: Secondary | ICD-10-CM | POA: Diagnosis not present

## 2017-08-07 DIAGNOSIS — M5431 Sciatica, right side: Secondary | ICD-10-CM | POA: Diagnosis not present

## 2017-08-11 DIAGNOSIS — M9903 Segmental and somatic dysfunction of lumbar region: Secondary | ICD-10-CM | POA: Diagnosis not present

## 2017-08-11 DIAGNOSIS — M9905 Segmental and somatic dysfunction of pelvic region: Secondary | ICD-10-CM | POA: Diagnosis not present

## 2017-08-11 DIAGNOSIS — M5431 Sciatica, right side: Secondary | ICD-10-CM | POA: Diagnosis not present

## 2017-08-11 DIAGNOSIS — M5416 Radiculopathy, lumbar region: Secondary | ICD-10-CM | POA: Diagnosis not present

## 2017-08-12 ENCOUNTER — Telehealth: Payer: Self-pay | Admitting: Family Medicine

## 2017-08-12 DIAGNOSIS — M5431 Sciatica, right side: Secondary | ICD-10-CM | POA: Diagnosis not present

## 2017-08-12 DIAGNOSIS — M5416 Radiculopathy, lumbar region: Secondary | ICD-10-CM | POA: Diagnosis not present

## 2017-08-12 DIAGNOSIS — M9905 Segmental and somatic dysfunction of pelvic region: Secondary | ICD-10-CM | POA: Diagnosis not present

## 2017-08-12 DIAGNOSIS — M9903 Segmental and somatic dysfunction of lumbar region: Secondary | ICD-10-CM | POA: Diagnosis not present

## 2017-08-12 NOTE — Telephone Encounter (Signed)
Pt has vaginal discharge and asked if she could have a prescription called to [facility] in [place].  Her call back number is [phone]

## 2017-08-12 NOTE — Telephone Encounter (Signed)
Can you call patient back to clarify her symptoms, she was recently seen by her GYN [facility]) Dr Enzo Bi in 06/2017 and given Flagyl (Metronidazole) for BV, if it is similar to this and just a recurrent episode I would be willing to send in a one time refill, however any further would need to come from Dr Enzo Bi to make sure she did not need any other testing or treatment.  Alternatively, if she has new discharge symptoms with itching and more like yeast infection, if she recently had antibiotics or other cause to trigger her symptoms, we can consider a one time rx of Diflucan for yeast vaginitis.  Otherwise, she should follow-up with GYN for a pelvic exam if it is not improving.  [name], [facility] 08/12/2017, 5:16 PM

## 2017-08-13 ENCOUNTER — Telehealth: Payer: Self-pay | Admitting: Obstetrics and Gynecology

## 2017-08-13 NOTE — Telephone Encounter (Signed)
Called patient has same Sx like in 07/18 has seen GYN so advised pt to call gyn office sinceshe Dr. Enzo Bi is treating for this Sx and also advised her as per Dr. Raliegh Ip.

## 2017-08-13 NOTE — Telephone Encounter (Signed)
Pt aware per mad nothing per vagina until seen on Tuesday.

## 2017-08-13 NOTE — Telephone Encounter (Signed)
Patient called stating she still has vaginal discharge and odor. Please Advise.

## 2017-08-13 NOTE — Telephone Encounter (Signed)
Left message for patient to call back  

## 2017-08-13 NOTE — Telephone Encounter (Signed)
Pt states she has had a d/c x 1 week. Thick and white. Pos odor. NO IC x 2 months. Appt made for Tuesday 9/11 at 11:15. She wants to know what she can do until appt? Pls advise.

## 2017-08-14 DIAGNOSIS — M5416 Radiculopathy, lumbar region: Secondary | ICD-10-CM | POA: Diagnosis not present

## 2017-08-14 DIAGNOSIS — M9905 Segmental and somatic dysfunction of pelvic region: Secondary | ICD-10-CM | POA: Diagnosis not present

## 2017-08-14 DIAGNOSIS — M5431 Sciatica, right side: Secondary | ICD-10-CM | POA: Diagnosis not present

## 2017-08-14 DIAGNOSIS — M9903 Segmental and somatic dysfunction of lumbar region: Secondary | ICD-10-CM | POA: Diagnosis not present

## 2017-08-17 DIAGNOSIS — M9903 Segmental and somatic dysfunction of lumbar region: Secondary | ICD-10-CM | POA: Diagnosis not present

## 2017-08-17 DIAGNOSIS — M5431 Sciatica, right side: Secondary | ICD-10-CM | POA: Diagnosis not present

## 2017-08-17 DIAGNOSIS — M5416 Radiculopathy, lumbar region: Secondary | ICD-10-CM | POA: Diagnosis not present

## 2017-08-17 DIAGNOSIS — M9905 Segmental and somatic dysfunction of pelvic region: Secondary | ICD-10-CM | POA: Diagnosis not present

## 2017-08-18 ENCOUNTER — Ambulatory Visit (INDEPENDENT_AMBULATORY_CARE_PROVIDER_SITE_OTHER): Payer: Medicare Other | Admitting: Obstetrics and Gynecology

## 2017-08-18 ENCOUNTER — Encounter: Payer: Self-pay | Admitting: Obstetrics and Gynecology

## 2017-08-18 VITALS — BP 148/84 | HR 76 | Ht 65.0 in | Wt 171.5 lb

## 2017-08-18 DIAGNOSIS — B373 Candidiasis of vulva and vagina: Secondary | ICD-10-CM | POA: Diagnosis not present

## 2017-08-18 DIAGNOSIS — N898 Other specified noninflammatory disorders of vagina: Secondary | ICD-10-CM

## 2017-08-18 DIAGNOSIS — I70209 Unspecified atherosclerosis of native arteries of extremities, unspecified extremity: Secondary | ICD-10-CM | POA: Diagnosis not present

## 2017-08-18 DIAGNOSIS — B3731 Acute candidiasis of vulva and vagina: Secondary | ICD-10-CM

## 2017-08-18 MED ORDER — TERCONAZOLE 0.4 % VA CREA: 1.0000 | TOPICAL_CREAM | Freq: Every day | VAGINAL | 0 refills | Status: DC

## 2017-08-18 NOTE — Progress Notes (Signed)
Chief complaint: 1. Vaginal discharge  Patient reports history of 4 days of heavy vaginal discharge without significant itching or burning or pelvic pain. She does note a mild malodor. She states that she has not had sex in 4 months. Most recent Nuswab testing demonstrated Trichomonas vaginalis which was treated.  Past Medical History:  Diagnosis Date  . Abnormal Pap smear of cervix 07/2015   needed Leep- did not get one  . Anxiety   . Depression   . GERD (gastroesophageal reflux disease)   . High cholesterol   . Hypertension     Past Surgical History:  Procedure Laterality Date  . CESAREAN SECTION    . PANCREAS BIOPSY     Abnormality, resulted to be non malignant  . SPLENECTOMY, TOTAL     Removal of spleen in order to obtain pancreatic biopsy  . stent leg    . TUBAL LIGATION      Review of Systems  Genitourinary: Negative for dysuria and urgency.       Vaginal discharge Mild malodor   All other systems reviewed and are negative.  OBJECTIVE: BP (!) 148/84   Pulse 76   Ht 5\' 5"  (1.651 m)   Wt 171 lb 8 oz (77.8 kg)   BMI 28.54 kg/m  Pleasant female in no acute distress. Alert and oriented Abdomen: Soft, nontender Bladder: Nontender Pelvic: External genitalia normal BUS-normal Vagina-minimal white secretions, patchy; no malodor Cervix-no lesions Bimanual-uterus nontender; adnexa nontender Rectovaginal-normal external exam  PROCEDURE: Wet prep Normal saline-moderate white blood cells; no Trichomonas; no clue cells KOH-yeast present  ASSESSMENT: 1. Vaginal discharge 2. Suspected Monilia vaginitis  PLAN: 1. Nuswab plus 2. Wet prep is noted 3. Terazol 7 cream 4. Results from testing will be made available 5. Follow-up when necessary  Brayton Mars, MD  Note: This dictation was prepared with Dragon dictation along with smaller phrase technology. Any transcriptional errors that result from this process are unintentional.

## 2017-08-18 NOTE — Patient Instructions (Signed)
1. Nu swab testing is sent for vaginitis evaluation 2. Wet prep is suggestive of yeast infection 3. Use Terazol 7 cream intravaginal 1 week 4. Results from testing will be made available.

## 2017-08-19 ENCOUNTER — Encounter: Payer: Self-pay | Admitting: Urology

## 2017-08-19 ENCOUNTER — Ambulatory Visit: Payer: Medicare Other | Admitting: Urology

## 2017-08-19 DIAGNOSIS — M5431 Sciatica, right side: Secondary | ICD-10-CM | POA: Diagnosis not present

## 2017-08-19 DIAGNOSIS — M9903 Segmental and somatic dysfunction of lumbar region: Secondary | ICD-10-CM | POA: Diagnosis not present

## 2017-08-19 DIAGNOSIS — M9905 Segmental and somatic dysfunction of pelvic region: Secondary | ICD-10-CM | POA: Diagnosis not present

## 2017-08-19 DIAGNOSIS — M5416 Radiculopathy, lumbar region: Secondary | ICD-10-CM | POA: Diagnosis not present

## 2017-08-21 LAB — NUSWAB VAGINITIS PLUS (VG+)
Candida albicans, NAA: POSITIVE — AB
Candida glabrata, NAA: NEGATIVE
Chlamydia trachomatis, NAA: NEGATIVE
Neisseria gonorrhoeae, NAA: NEGATIVE
TRICH VAG BY NAA: NEGATIVE

## 2017-08-24 ENCOUNTER — Telehealth: Payer: Self-pay | Admitting: Obstetrics and Gynecology

## 2017-08-24 DIAGNOSIS — M9905 Segmental and somatic dysfunction of pelvic region: Secondary | ICD-10-CM | POA: Diagnosis not present

## 2017-08-24 DIAGNOSIS — M9903 Segmental and somatic dysfunction of lumbar region: Secondary | ICD-10-CM | POA: Diagnosis not present

## 2017-08-24 DIAGNOSIS — M5416 Radiculopathy, lumbar region: Secondary | ICD-10-CM | POA: Diagnosis not present

## 2017-08-24 DIAGNOSIS — M5431 Sciatica, right side: Secondary | ICD-10-CM | POA: Diagnosis not present

## 2017-08-24 NOTE — Telephone Encounter (Signed)
Pt left voicemail stating she was returning a missed call. Looks like Crystal tried to reach pt for lab results. Pt request a call back. Please advise. Thanks TNP

## 2017-08-25 NOTE — Telephone Encounter (Signed)
Pt aware nuswab pos for yeast only. Complete terazol.

## 2017-08-26 DIAGNOSIS — M5431 Sciatica, right side: Secondary | ICD-10-CM | POA: Diagnosis not present

## 2017-08-26 DIAGNOSIS — M9903 Segmental and somatic dysfunction of lumbar region: Secondary | ICD-10-CM | POA: Diagnosis not present

## 2017-08-26 DIAGNOSIS — M9905 Segmental and somatic dysfunction of pelvic region: Secondary | ICD-10-CM | POA: Diagnosis not present

## 2017-08-26 DIAGNOSIS — M5416 Radiculopathy, lumbar region: Secondary | ICD-10-CM | POA: Diagnosis not present

## 2017-08-31 DIAGNOSIS — M5416 Radiculopathy, lumbar region: Secondary | ICD-10-CM | POA: Diagnosis not present

## 2017-08-31 DIAGNOSIS — M9903 Segmental and somatic dysfunction of lumbar region: Secondary | ICD-10-CM | POA: Diagnosis not present

## 2017-08-31 DIAGNOSIS — M5431 Sciatica, right side: Secondary | ICD-10-CM | POA: Diagnosis not present

## 2017-08-31 DIAGNOSIS — M9905 Segmental and somatic dysfunction of pelvic region: Secondary | ICD-10-CM | POA: Diagnosis not present

## 2017-09-03 DIAGNOSIS — M5431 Sciatica, right side: Secondary | ICD-10-CM | POA: Diagnosis not present

## 2017-09-03 DIAGNOSIS — M5416 Radiculopathy, lumbar region: Secondary | ICD-10-CM | POA: Diagnosis not present

## 2017-09-03 DIAGNOSIS — M9903 Segmental and somatic dysfunction of lumbar region: Secondary | ICD-10-CM | POA: Diagnosis not present

## 2017-09-03 DIAGNOSIS — M9905 Segmental and somatic dysfunction of pelvic region: Secondary | ICD-10-CM | POA: Diagnosis not present

## 2017-09-04 DIAGNOSIS — M9903 Segmental and somatic dysfunction of lumbar region: Secondary | ICD-10-CM | POA: Diagnosis not present

## 2017-09-04 DIAGNOSIS — M5416 Radiculopathy, lumbar region: Secondary | ICD-10-CM | POA: Diagnosis not present

## 2017-09-04 DIAGNOSIS — M5431 Sciatica, right side: Secondary | ICD-10-CM | POA: Diagnosis not present

## 2017-09-04 DIAGNOSIS — M9905 Segmental and somatic dysfunction of pelvic region: Secondary | ICD-10-CM | POA: Diagnosis not present

## 2017-09-07 DIAGNOSIS — M5416 Radiculopathy, lumbar region: Secondary | ICD-10-CM | POA: Diagnosis not present

## 2017-09-07 DIAGNOSIS — M5431 Sciatica, right side: Secondary | ICD-10-CM | POA: Diagnosis not present

## 2017-09-07 DIAGNOSIS — M9905 Segmental and somatic dysfunction of pelvic region: Secondary | ICD-10-CM | POA: Diagnosis not present

## 2017-09-07 DIAGNOSIS — M9903 Segmental and somatic dysfunction of lumbar region: Secondary | ICD-10-CM | POA: Diagnosis not present

## 2017-09-09 DIAGNOSIS — M5431 Sciatica, right side: Secondary | ICD-10-CM | POA: Diagnosis not present

## 2017-09-09 DIAGNOSIS — M9903 Segmental and somatic dysfunction of lumbar region: Secondary | ICD-10-CM | POA: Diagnosis not present

## 2017-09-09 DIAGNOSIS — M9905 Segmental and somatic dysfunction of pelvic region: Secondary | ICD-10-CM | POA: Diagnosis not present

## 2017-09-09 DIAGNOSIS — M5416 Radiculopathy, lumbar region: Secondary | ICD-10-CM | POA: Diagnosis not present

## 2017-09-15 DIAGNOSIS — M5416 Radiculopathy, lumbar region: Secondary | ICD-10-CM | POA: Diagnosis not present

## 2017-09-15 DIAGNOSIS — M9905 Segmental and somatic dysfunction of pelvic region: Secondary | ICD-10-CM | POA: Diagnosis not present

## 2017-09-15 DIAGNOSIS — M5431 Sciatica, right side: Secondary | ICD-10-CM | POA: Diagnosis not present

## 2017-09-15 DIAGNOSIS — M9903 Segmental and somatic dysfunction of lumbar region: Secondary | ICD-10-CM | POA: Diagnosis not present

## 2017-09-21 ENCOUNTER — Other Ambulatory Visit: Payer: Self-pay | Admitting: Family Medicine

## 2017-09-21 DIAGNOSIS — M5431 Sciatica, right side: Secondary | ICD-10-CM | POA: Diagnosis not present

## 2017-09-21 DIAGNOSIS — F5101 Primary insomnia: Secondary | ICD-10-CM

## 2017-09-21 DIAGNOSIS — M9903 Segmental and somatic dysfunction of lumbar region: Secondary | ICD-10-CM | POA: Diagnosis not present

## 2017-09-21 DIAGNOSIS — M5416 Radiculopathy, lumbar region: Secondary | ICD-10-CM | POA: Diagnosis not present

## 2017-09-21 DIAGNOSIS — F418 Other specified anxiety disorders: Secondary | ICD-10-CM

## 2017-09-21 DIAGNOSIS — M9905 Segmental and somatic dysfunction of pelvic region: Secondary | ICD-10-CM | POA: Diagnosis not present

## 2017-09-23 DIAGNOSIS — M5416 Radiculopathy, lumbar region: Secondary | ICD-10-CM | POA: Diagnosis not present

## 2017-09-23 DIAGNOSIS — M9903 Segmental and somatic dysfunction of lumbar region: Secondary | ICD-10-CM | POA: Diagnosis not present

## 2017-09-23 DIAGNOSIS — M9905 Segmental and somatic dysfunction of pelvic region: Secondary | ICD-10-CM | POA: Diagnosis not present

## 2017-09-23 DIAGNOSIS — M5431 Sciatica, right side: Secondary | ICD-10-CM | POA: Diagnosis not present

## 2017-09-28 ENCOUNTER — Telehealth: Payer: Self-pay | Admitting: Family Medicine

## 2017-09-28 DIAGNOSIS — M48061 Spinal stenosis, lumbar region without neurogenic claudication: Secondary | ICD-10-CM

## 2017-09-28 MED ORDER — PREDNISONE 20 MG PO TABS: ORAL_TABLET | ORAL | 0 refills | Status: DC

## 2017-09-28 NOTE — Telephone Encounter (Signed)
She should not be due for another rx of Zolpidem (Ambien), it was already refilled, printed and faxed to [facility] on 09/21/17. She should have this rx and 2 refills available.  I can refill prednisone for one more taper, however we need to be cautious with too much prednisone use.  [name], [facility] 09/28/2017, 11:46 AM

## 2017-09-28 NOTE — Telephone Encounter (Signed)
See other telephone note dated today 09/28/17

## 2017-09-28 NOTE — Telephone Encounter (Signed)
Pt advised.

## 2017-09-28 NOTE — Telephone Encounter (Signed)
Pt. Called requesting a refill on  Prednisone, zolpidem [facility] Pt.call back  # is  [phone]

## 2017-10-01 ENCOUNTER — Ambulatory Visit: Payer: Medicare Other | Admitting: Family Medicine

## 2017-10-02 ENCOUNTER — Ambulatory Visit (INDEPENDENT_AMBULATORY_CARE_PROVIDER_SITE_OTHER): Payer: Medicare Other | Admitting: Family Medicine

## 2017-10-02 ENCOUNTER — Encounter: Payer: Self-pay | Admitting: Family Medicine

## 2017-10-02 ENCOUNTER — Other Ambulatory Visit: Payer: Self-pay | Admitting: Family Medicine

## 2017-10-02 VITALS — BP 130/70 | HR 75 | Temp 98.4°F | Resp 16 | Ht 65.0 in | Wt 165.0 lb

## 2017-10-02 DIAGNOSIS — Z114 Encounter for screening for human immunodeficiency virus [HIV]: Secondary | ICD-10-CM | POA: Diagnosis not present

## 2017-10-02 DIAGNOSIS — Z206 Contact with and (suspected) exposure to human immunodeficiency virus [HIV]: Secondary | ICD-10-CM

## 2017-10-02 DIAGNOSIS — M5441 Lumbago with sciatica, right side: Secondary | ICD-10-CM

## 2017-10-02 DIAGNOSIS — Z7251 High risk heterosexual behavior: Secondary | ICD-10-CM | POA: Diagnosis not present

## 2017-10-02 DIAGNOSIS — G8929 Other chronic pain: Secondary | ICD-10-CM

## 2017-10-02 DIAGNOSIS — Z9189 Other specified personal risk factors, not elsewhere classified: Secondary | ICD-10-CM | POA: Diagnosis not present

## 2017-10-02 DIAGNOSIS — F5101 Primary insomnia: Secondary | ICD-10-CM

## 2017-10-02 MED ORDER — SUVOREXANT 10 MG PO TABS: 10.0000 mg | ORAL_TABLET | Freq: Every evening | ORAL | 2 refills | Status: DC | PRN

## 2017-10-02 MED ORDER — TIZANIDINE HCL 4 MG PO TABS: 4.0000 mg | ORAL_TABLET | Freq: Three times a day (TID) | ORAL | 2 refills | Status: DC | PRN

## 2017-10-02 NOTE — Progress Notes (Signed)
Subjective:    Patient ID: Peggy Russo, female    DOB: Jan 08, 1956, 61 y.o.   MRN: [id number]  Peggy Russo is a 61 y.o. female presenting on 10/02/2017 for Medication Refill (prevent HIV); Back Pain; and Insomnia   HPI   HIV Exposure / High Risk Sexual Behavior - Reports that her husband is HIV positive and she is high risk for HIV exposure due to sexual intercourse. She has followed up with routine HIV screening in past has been negative. She was previously on Truvada and did well on this medicine for reducing risk of HIV transmission. Unsure why she stopped in past. Never had problem with kidney function. - requesting new rx Truvada and HIV test today - She denies any other STD exposure and declines other STD testing.  FOLLOW-UP Chronic Bilateral Low Back Pain / R Sciatica: - Last visit with me 07/13/17, for same problem, treated with increased duloxetine from 30 to 60mg  and confirmed referral to [facility], see prior notes for background information. - Interval update with she was seen by Dr Colbert Coyer [facility] neurosurgery) on 07/28/17, lumbar epidural injection was offered, but she declined and then returned requesting this injection, ultimately she states it was never scheduled she remains "concerned about a needle injected into her spine", she contacted our office and she was switched from Flexeril to Tizanidine, and added Meloxicam. She also requested chiropractor, sent referral to Dr Iris Pert locally - Today patient reports overall back is improved after Chiropractor. She will follow-up with them as needed - No new concerns or back pain at this time - Request refill Tizanidine, helps her rest at night but does not do too much for back pain when she had it  FOLLOW-UP Depression / Insomnia / Reduced Appetite - Last visit with me 07/13/17, for same problem, treated with increased Duloxetine from 30 to 60mg  and added Ambien 5mg  nightly PRN sleep due to chronic insomnia, see  prior notes for background information. - Interval update with she self tapered off of Duloxetine due to "not helping her sleep" and she states not needed for her mood, also tried Zolpidem 5mg  PRN nightly without any improvement either, states it did not really help her fall asleep and she would still wake up - Today patient reports still concern with chronic insomnia. Does not attribute this to any other cause or factor, not pain as well, she seems to provide conflicting information as in past thought pain was part if it keeping her awake. - Sometimes goes to bed 8pm and doesn't fall asleep until 11pm, wake up by 1am - She cannot take nap during day - Failed multiple medications in past including Trazodone, Celexa, Mirtazapine, Duloxetine, Ambien - Not established with Psychiatry or Psychology therapy, was given resource for [facility] (has not followed up) - Never had PSG - Regarding appetite seems improved now, states she was concern about wt gain up to 170 now has tried to lose weight and did so successfully - Denies any symptoms of OSA (no excessive daytime fatigue or sleepiness, snoring, apnea episodes) - Denies dysphagia, vomiting, abdominal pain, dark stools, suicidal or homicidal ideation, anxiety, panic attacks, urinary frequency, unintentional weight loss  Health Maintenance: Due for Flu Shot, declines today despite counseling on benefits  Depression screen [facility] 2/9 10/02/2017 07/13/2017 06/09/2017  Decreased Interest 0 2 1  Down, Depressed, Hopeless 0 0 1  PHQ - 2 Score 0 2 2  Altered sleeping 3 3 2   Tired, decreased energy 0  2 1  Change in appetite 2 3 0  Feeling bad or failure about yourself  0 0 0  Trouble concentrating 0 1 0  Moving slowly or fidgety/restless 0 0 0  Suicidal thoughts 0 0 0  PHQ-9 Score 5 11 5   Difficult doing work/chores Not difficult at all - Not difficult at all    Social History  Substance Use Topics  . Smoking status: Current Every Day Smoker    Packs/day:  1.00    Years: 25.00    Types: Cigarettes  . Smokeless tobacco: Current User  . Alcohol use No    Review of Systems Per HPI unless specifically indicated above     Objective:    BP 130/70 (BP Location: Left Arm, Cuff Size: Normal)   Pulse 75   Temp 98.4 F (36.9 C) (Oral)   Resp 16   Ht 5\' 5"  (1.651 m)   Wt 165 lb (74.8 kg)   BMI 27.46 kg/m   Wt Readings from Last 3 Encounters:  10/02/17 165 lb (74.8 kg)  08/18/17 171 lb 8 oz (77.8 kg)  07/13/17 168 lb (76.2 kg)    Physical Exam  Constitutional: She is oriented to person, place, and time. She appears well-developed and well-nourished. No distress.  Well-appearing, comfortable, cooperative  HENT:  Head: Normocephalic and atraumatic.  Mouth/Throat: Oropharynx is clear and moist.  Eyes: Conjunctivae are normal. Right eye exhibits no discharge. Left eye exhibits no discharge.  Neck: Normal range of motion. Neck supple.  Cardiovascular: Normal rate, regular rhythm, normal heart sounds and intact distal pulses.   No murmur heard. Pulmonary/Chest: Effort normal and breath sounds normal. No respiratory distress. She has no wheezes. She has no rales.  Musculoskeletal: Normal range of motion. She exhibits no edema (trace L ankle only).  Did not re-examine back today  Normal gait  Neurological: She is alert and oriented to person, place, and time.  Skin: Skin is warm and dry. No rash noted. She is not diaphoretic. No erythema.  Psychiatric: She has a normal mood and affect. Her behavior is normal.  Well groomed, good eye contact, normal speech and thoughts  Nursing note and vitals reviewed.   Results for orders placed or performed in visit on 08/18/17  NuSwab Vaginitis Plus (VG+)  Result Value Ref Range   Atopobium vaginae Low - 0 Score   BVAB 2 Low - 0 Score   Megasphaera 1 Low - 0 Score   Candida albicans, NAA Positive (A) Negative   Candida glabrata, NAA Negative Negative   Trich vag by NAA Negative Negative    Chlamydia trachomatis, NAA Negative Negative   Neisseria gonorrhoeae, NAA Negative Negative      Assessment & Plan:   Problem List Items Addressed This Visit    At risk for sexually transmitted disease due to partner with HIV    Remains high risk for HIV exposure with husband/partner dx with HIV - Previously on Truvada  Plan: 1. Counseling on safe sex practices with condom use to reduce risk of transmission 2. Check HIV screen today - pending result, if negative then will proceed with new rx Truvada for prevention, reviewed potential side effects and risks, especially possible kidney injury, last Cr was normal 06/2017 3. Follow-up q 3 months for routine HIV screening and CMET Cr trend, offered STD testing intervals as well but she declines      Relevant Orders   HIV antibody   Chronic low back pain    Stable without  flare or problem today Last seen [facility] Neurosurgery 07/28/17, see note in [place] - she was advised to have Lumbar ESI but she declined - Improved on Chiropractor (Dr Iris Pert)  Plan: - Today no change, she is off Duloxetine, continues on Meloxicam, Tizanidine - Follow-up as needed, again I have limited other options for her, she should continue to f/u with both chiropractor or if interested in [facility] return to Neurosurgery      Insomnia - Primary    Persistent insomnia, now seems less related to chronic back pain as does not endorse pain keeping her awake (conflicting from last visit) and seems her back pain is better now - Chronic problem with inaccurate expectations of medicine to completely resolve sleeping problem - Failed Sertraline, Celexa, Mirtazapine, Trazodone, Ambien, Duloxetine  Plan: 1. DC Ambien 2. Start trial on new med Belsomra (Suvorexant 10mg ) nightly PRN #30 + 2 refills for 3 month supply, printed given to patient 3. Follow-up in future may need [place] Controlled Contract if continues med - lastly offered that if this med is not helping I would  recommend referral to sleep specialist and possible PSG, no symptoms of OSA      Relevant Medications   Suvorexant 10 MG TABS    Other Visit Diagnoses    HIV exposure       Relevant Orders   HIV antibody   High risk heterosexual behavior       Relevant Orders   HIV antibody   Screening for HIV (human immunodeficiency virus)       Relevant Orders   HIV antibody      Meds ordered this encounter  Medications  . Suvorexant 10 MG TABS    Sig: Take 10 mg by mouth at bedtime as needed.    Dispense:  30 tablet    Refill:  2  . DISCONTD: tiZANidine (ZANAFLEX) 4 MG tablet    Sig: Take 1 tablet (4 mg total) by mouth every 8 (eight) hours as needed for muscle spasms.    Dispense:  60 tablet    Refill:  2    Follow up plan: Return in about 3 months (around 01/02/2018) for Insomnia, Lab Review/MedRefill.  [name], [facility] 10/02/2017, 2:02 PM

## 2017-10-02 NOTE — Assessment & Plan Note (Signed)
Persistent insomnia, now seems less related to chronic back pain as does not endorse pain keeping her awake (conflicting from last visit) and seems her back pain is better now - Chronic problem with inaccurate expectations of medicine to completely resolve sleeping problem - Failed Sertraline, Celexa, Mirtazapine, Trazodone, Ambien, Duloxetine  Plan: 1. DC Ambien 2. Start trial on new med Belsomra (Suvorexant 10mg ) nightly PRN #30 + 2 refills for 3 month supply, printed given to patient 3. Follow-up in future may need [place] Controlled Contract if continues med - lastly offered that if this med is not helping I would recommend referral to sleep specialist and possible PSG, no symptoms of OSA

## 2017-10-02 NOTE — Patient Instructions (Addendum)
Thank you for coming to the clinic today.  1.  Today will check routine HIV screen test - if negative, will send in rx Truvada to your pharmacy.  Will need to check blood work every 3 months for kidney and HIV screen  2. Start new sleeping medication - Suvorexant (Belsomra) 10mg  nightly take about 30 min before bedtime  3. Refilled Tizanidine (Zanaflex) muscle relaxant take as needed  4. Discontinued Duloxetine, Cymbalta, Zolpidem  If sleep is not improved still, then next step is a referral to a sleep specialist.  DUE for NON FASTING BLOOD WORK   SCHEDULE "Lab Only" visit in the morning at the clinic for lab draw in 3 MONTHS  - Make sure Lab Only appointment is at about 1 week before your next appointment, so that results will be available  For Lab Results, once available within 2-3 days of blood draw, you can can log in to MyChart online to view your results and a brief explanation. Also, we can discuss results at next follow-up visit.   Please schedule a Follow-up Appointment to: Return in about 3 months (around 01/02/2018) for Insomnia, Lab Review/MedRefill.  If you have any other questions or concerns, please feel free to call the clinic or send a message through [place]. You may also schedule an earlier appointment if necessary.  Additionally, you may be receiving a survey about your experience at our clinic within a few days to 1 week by e-mail or mail. We value your feedback.  [name], DO [place]

## 2017-10-02 NOTE — Assessment & Plan Note (Signed)
Stable without flare or problem today Last seen [facility] Neurosurgery 07/28/17, see note in [place] - she was advised to have Lumbar ESI but she declined - Improved on Chiropractor (Dr Iris Pert)  Plan: - Today no change, she is off Duloxetine, continues on Meloxicam, Tizanidine - Follow-up as needed, again I have limited other options for her, she should continue to f/u with both chiropractor or if interested in [facility] return to Neurosurgery

## 2017-10-02 NOTE — Assessment & Plan Note (Signed)
Remains high risk for HIV exposure with husband/partner dx with HIV - Previously on Truvada  Plan: 1. Counseling on safe sex practices with condom use to reduce risk of transmission 2. Check HIV screen today - pending result, if negative then will proceed with new rx Truvada for prevention, reviewed potential side effects and risks, especially possible kidney injury, last Cr was normal 06/2017 3. Follow-up q 3 months for routine HIV screening and CMET Cr trend, offered STD testing intervals as well but she declines

## 2017-10-03 LAB — HIV ANTIBODY (ROUTINE TESTING W REFLEX): HIV: NONREACTIVE

## 2017-10-04 ENCOUNTER — Other Ambulatory Visit: Payer: Self-pay | Admitting: Family Medicine

## 2017-10-04 DIAGNOSIS — Z206 Contact with and (suspected) exposure to human immunodeficiency virus [HIV]: Secondary | ICD-10-CM

## 2017-10-04 DIAGNOSIS — Z7251 High risk heterosexual behavior: Secondary | ICD-10-CM

## 2017-10-04 DIAGNOSIS — Z79899 Other long term (current) drug therapy: Secondary | ICD-10-CM

## 2017-10-04 DIAGNOSIS — Z114 Encounter for screening for human immunodeficiency virus [HIV]: Secondary | ICD-10-CM

## 2017-10-04 DIAGNOSIS — Z9189 Other specified personal risk factors, not elsewhere classified: Secondary | ICD-10-CM

## 2017-10-04 DIAGNOSIS — R7303 Prediabetes: Secondary | ICD-10-CM

## 2017-10-04 MED ORDER — EMTRICITABINE-TENOFOVIR DF 200-300 MG PO TABS: 1.0000 | ORAL_TABLET | Freq: Every day | ORAL | 2 refills | Status: DC

## 2017-10-05 DIAGNOSIS — M9905 Segmental and somatic dysfunction of pelvic region: Secondary | ICD-10-CM | POA: Diagnosis not present

## 2017-10-05 DIAGNOSIS — M5416 Radiculopathy, lumbar region: Secondary | ICD-10-CM | POA: Diagnosis not present

## 2017-10-05 DIAGNOSIS — M9903 Segmental and somatic dysfunction of lumbar region: Secondary | ICD-10-CM | POA: Diagnosis not present

## 2017-10-05 DIAGNOSIS — M5431 Sciatica, right side: Secondary | ICD-10-CM | POA: Diagnosis not present

## 2017-10-07 ENCOUNTER — Telehealth: Payer: Self-pay | Admitting: Family Medicine

## 2017-10-07 DIAGNOSIS — M5431 Sciatica, right side: Secondary | ICD-10-CM | POA: Diagnosis not present

## 2017-10-07 DIAGNOSIS — M9905 Segmental and somatic dysfunction of pelvic region: Secondary | ICD-10-CM | POA: Diagnosis not present

## 2017-10-07 DIAGNOSIS — M5416 Radiculopathy, lumbar region: Secondary | ICD-10-CM | POA: Diagnosis not present

## 2017-10-07 DIAGNOSIS — M9903 Segmental and somatic dysfunction of lumbar region: Secondary | ICD-10-CM | POA: Diagnosis not present

## 2017-10-07 NOTE — Telephone Encounter (Signed)
Received notice that patient called today requesting refill on Prednisone and thought it needed to be higher dose this time as last rx did not seem to be as effected. She was most recently given a repeat taper of Prednisone on 09/28/17 for chronic low back pain and sciatica, with 60mg  x 2 days then 40mg  x 2 days then 20mg  x 3 days for 7 day taper.  She has been instructed before that this was the last prednisone taper for a while, and that long-term prednisone is not a safe medication as it can cause many whole body complications such as weight gain, high blood pressure, elevated blood sugar among other issues.  I do not plan to refill the Prednisone for now, we may consider another treatment with prednisone but the soonest would have to be >3 months.  As mentioned before, I would encourage her to return to her Spine Specialist and possibly proceed with the injection (which is also a steroid) but more localized for her low back to help her problem.  [name], [facility] 10/07/2017, 10:04 AM

## 2017-10-07 NOTE — Telephone Encounter (Signed)
Advised patient as per Dr. Raliegh Ip but she was very upset and argumentative and wanted higher dose of prednisone and as advised for side effect of gaining weight and increasing sugar level, pt's response is that she wants to gain weight and refused to see spine specialist and don't want any needle. Once again remind patient about Dr. [place] Ranger recommendation.

## 2017-10-13 ENCOUNTER — Encounter: Payer: Self-pay | Admitting: Family Medicine

## 2017-10-13 ENCOUNTER — Ambulatory Visit (INDEPENDENT_AMBULATORY_CARE_PROVIDER_SITE_OTHER): Payer: Medicare Other | Admitting: Family Medicine

## 2017-10-13 VITALS — BP 121/82 | HR 93 | Temp 98.4°F | Resp 16 | Ht 65.0 in | Wt 159.0 lb

## 2017-10-13 DIAGNOSIS — M5416 Radiculopathy, lumbar region: Secondary | ICD-10-CM | POA: Diagnosis not present

## 2017-10-13 DIAGNOSIS — R630 Anorexia: Secondary | ICD-10-CM | POA: Diagnosis not present

## 2017-10-13 DIAGNOSIS — R634 Abnormal weight loss: Secondary | ICD-10-CM | POA: Insufficient documentation

## 2017-10-13 DIAGNOSIS — M9905 Segmental and somatic dysfunction of pelvic region: Secondary | ICD-10-CM | POA: Diagnosis not present

## 2017-10-13 DIAGNOSIS — M9903 Segmental and somatic dysfunction of lumbar region: Secondary | ICD-10-CM | POA: Diagnosis not present

## 2017-10-13 DIAGNOSIS — M5431 Sciatica, right side: Secondary | ICD-10-CM | POA: Diagnosis not present

## 2017-10-13 MED ORDER — MEGESTROL ACETATE 20 MG PO TABS: 20.0000 mg | ORAL_TABLET | Freq: Every day | ORAL | 0 refills | Status: DC

## 2017-10-13 NOTE — Assessment & Plan Note (Signed)
Still uncertain etiology, history is difficult to follow and cannot locate an organic cause for her inability to eat other than possible nausea and affected taste as most likely. However she has appetite at times and 'feels hungry' - Non specific and does not raise any significant red flags, no abdominal pain, vomiting, dysphagia - Weight loss 12 lbs in 2-3 months - Suspected may be secondary to chronic pain with some nausea - Failed Mirtazapine and SSRI/SNRI meds, tried Zofran unsure about results  Plan: 1. Discussion on uncertain etiology of her problem, she has had lab evaluation before, and we have attempted to manage mood with SSRI and also insomnia. Now back pain seems better. Still problem with appetite and weight loss - Recommend 2nd opinion with referral to GI specialist - AGI, discuss problem further may rule out more mechanical or GI etiology and also consider imaging recommendations if needed in future if we cannot locate problem - Only other med to offer for appetite stimulant would be Megace reviewed this with her, trial at 20mg  daily for 1 month to see if effective, can adjust dose as needed, this is not a long-term option 2. Trial on nutritional supplement 3. Future consider nutritionist if need 4. Follow-up as needed or within 3 months

## 2017-10-13 NOTE — Patient Instructions (Addendum)
Thank you for coming to the clinic today.  1.  Start Megace 20mg  once daily - hormonal appetite stimulant - after 1 month we may need to adjust dose or reconsider   Referral to [facility] to discuss weight loss and poor appetite, to evaluate for any problem with your eating / esophagus / GI tract contributing to your symptoms. In future we may need to consider broader imaging to rule out other causes  Call them within 1-2 weeks if not heard back  ------------------------------------------------------------------ GASTROENTEROLOGY (GI)   [facility]) [place], [place] [address] Phone: [phone]  [facility]) [address] [place], [place] [address] Main: [phone]  Please schedule a Follow-up Appointment to: Return if symptoms worsen or fail to improve, for keep apt in January.  If you have any other questions or concerns, please feel free to call the clinic or send a message through [place]. You may also schedule an earlier appointment if necessary.  Additionally, you may be receiving a survey about your experience at our clinic within a few days to 1 week by e-mail or mail. We value your feedback.  Peggy Putnam, DO [place]

## 2017-10-13 NOTE — Progress Notes (Signed)
Subjective:    Patient ID: Peggy Russo, female    DOB: 01-07-1956, 61 y.o.   MRN: [id number]  Peggy Russo is a 61 y.o. female presenting on 10/13/2017 for lost of appetite   HPI   FOLLOW-UP Reduced Appetite / Weight Loss unintentional - Last visit with me 07/13/17, for same problem poor appetite among other concerns, she seems to report waxing and waning problem with intermittent improvement and other days it is worsening, in past she was treated with Mirtazapine various doses without any benefit on appetite stimulation or help insomnia, treated with other SSRI medications for mood/depression and insomnia as well limited relief, see prior notes for background information. - Interval update with persistent problem, now seems worse and returns to discuss, concern with weight loss - Today patient reports still has problem with poor appetite and "can't eat", she has difficulty describing this, in past her symptoms mostly were due to poor appetite, now she is describing that she cannot eat if she tried, she cannot articulate why she cannot states it is not a problem of dysphagia or choking, and not a problem of regurgitation or vomiting, but she does admit some nausea from her medicines and other issues chronically. Often she can eat 1 meal a day but then has difficulty eating other meals and difficulty with snacks, she used to "love to eat" but now has had hard time, she states she has difficulty with taste as well - In the past too she tried to lose weight and did so successfully, but now cannot get on regular diet regimen - She does not attribute this to depression, mood or stress, has not established with Psychiatry - Failed multiple medications in past including Trazodone, Celexa, Mirtazapine, Duloxetine, Ambien - Denies dysphagia, vomiting, abdominal pain, dark stools, suicidal or homicidal ideation, anxiety, panic attacks, urinary frequency, unintentional weight loss  PMH - Chronic Low Back Pain -  unchanged currently, not seem to be worse, still seems improved. She was on prednisone before for her back and this has helped, now recently given rx for prednisone in October 2018 and states this did not help her weight gain or appetite  Health Maintenance: Due for Flu Shot, declines today despite counseling on benefits  Depression screen [facility] 2/9 10/02/2017 07/13/2017 06/09/2017  Decreased Interest 0 2 1  Down, Depressed, Hopeless 0 0 1  PHQ - 2 Score 0 2 2  Altered sleeping 3 3 2   Tired, decreased energy 0 2 1  Change in appetite 2 3 0  Feeling bad or failure about yourself  0 0 0  Trouble concentrating 0 1 0  Moving slowly or fidgety/restless 0 0 0  Suicidal thoughts 0 0 0  PHQ-9 Score 5 11 5   Difficult doing work/chores Not difficult at all - Not difficult at all    Social History   Tobacco Use  . Smoking status: Current Every Day Smoker    Packs/day: 1.00    Years: 25.00    Pack years: 25.00    Types: Cigarettes  . Smokeless tobacco: Current User  Substance Use Topics  . Alcohol use: No  . Drug use: Yes    Types: Marijuana    Comment: daily    Review of Systems Per HPI unless specifically indicated above     Objective:    BP 121/82 (BP Location: Left Arm, Patient Position: Sitting, Cuff Size: Normal)   Pulse 93   Temp 98.4 F (36.9 C) (Oral)   Resp 16  Ht 5\' 5"  (1.651 m)   Wt 159 lb (72.1 kg)   BMI 26.46 kg/m   Wt Readings from Last 3 Encounters:  10/13/17 159 lb (72.1 kg)  10/02/17 165 lb (74.8 kg)  08/18/17 171 lb 8 oz (77.8 kg)    Physical Exam  Constitutional: She is oriented to person, place, and time. She appears well-developed and well-nourished. No distress.  Well-appearing, comfortable, cooperative, slight weight loss  HENT:  Head: Normocephalic and atraumatic.  Mouth/Throat: Oropharynx is clear and moist.  Oropharynx clear without erythema, exudates, edema or asymmetry. Some poor dentition  Eyes: Conjunctivae are normal. Right eye exhibits no  discharge. Left eye exhibits no discharge.  Neck: Normal range of motion. Neck supple.  Cardiovascular: Normal rate.  Pulmonary/Chest: Effort normal.  Abdominal: Soft. Bowel sounds are normal. She exhibits no distension. There is no tenderness. There is no rebound.  Musculoskeletal: She exhibits no edema.  Neurological: She is alert and oriented to person, place, and time.  Skin: Skin is warm and dry. No rash noted. She is not diaphoretic. No erythema.  Psychiatric: She has a normal mood and affect. Her behavior is normal.  Well groomed, good eye contact, normal speech and thoughts  Nursing note and vitals reviewed.  Results for orders placed or performed in visit on 10/02/17  HIV antibody  Result Value Ref Range   HIV 1&2 Ab, 4th Generation NON-REACTIVE NON-REACTI      Assessment & Plan:   Problem List Items Addressed This Visit    Poor appetite    Still uncertain etiology, history is difficult to follow and cannot locate an organic cause for her inability to eat other than possible nausea and affected taste as most likely. However she has appetite at times and 'feels hungry' - Non specific and does not raise any significant red flags, no abdominal pain, vomiting, dysphagia - Weight loss 12 lbs in 2-3 months - Suspected may be secondary to chronic pain with some nausea - Failed Mirtazapine and SSRI/SNRI meds, tried Zofran unsure about results  Plan: 1. Discussion on uncertain etiology of her problem, she has had lab evaluation before, and we have attempted to manage mood with SSRI and also insomnia. Now back pain seems better. Still problem with appetite and weight loss - Recommend 2nd opinion with referral to GI specialist - AGI, discuss problem further may rule out more mechanical or GI etiology and also consider imaging recommendations if needed in future if we cannot locate problem - Only other med to offer for appetite stimulant would be Megace reviewed this with her, trial at  20mg  daily for 1 month to see if effective, can adjust dose as needed, this is not a long-term option 2. Trial on nutritional supplement 3. Future consider nutritionist if need 4. Follow-up as needed or within 3 months      Relevant Medications   megestrol (MEGACE) 20 MG tablet   Other Relevant Orders   Ambulatory referral to Gastroenterology   Unintentional weight loss - Primary   Relevant Medications   megestrol (MEGACE) 20 MG tablet   Other Relevant Orders   Ambulatory referral to Gastroenterology      Meds ordered this encounter  Medications  . megestrol (MEGACE) 20 MG tablet    Sig: Take 1 tablet (20 mg total) daily by mouth.    Dispense:  30 tablet    Refill:  0    Follow up plan: Return if symptoms worsen or fail to improve, for keep apt in  January.  [name], DO [facility] 10/13/2017, 1:23 PM

## 2017-10-14 ENCOUNTER — Encounter: Payer: Self-pay | Admitting: Gastroenterology

## 2017-10-14 ENCOUNTER — Ambulatory Visit (INDEPENDENT_AMBULATORY_CARE_PROVIDER_SITE_OTHER): Payer: Medicare Other | Admitting: Gastroenterology

## 2017-10-14 VITALS — BP 130/77 | HR 73 | Temp 98.4°F | Ht 65.0 in | Wt 161.6 lb

## 2017-10-14 DIAGNOSIS — R633 Feeding difficulties: Secondary | ICD-10-CM

## 2017-10-14 DIAGNOSIS — R6339 Other feeding difficulties: Secondary | ICD-10-CM

## 2017-10-14 NOTE — Progress Notes (Signed)
Vonda Antigua, MD [address] [place], [place], [place], [address] [address], [place], [place], [place], [address] Phone: [phone]  Fax: [phone]  Consultation  Referring Provider:     [name] * Primary Care Physician:  Olin Hauser, DO Primary Gastroenterologist:  Dr. Bonna Gains         Reason for Referral:     Weight loss Date of Consultation:  10/14/2017         HPI:   Peggy Russo is a 61 y.o. female presents with unintentional weight loss. No abdominal pain. Her weight in Aug 2018 was 165 lb and today's weight is 161 lbs. She denies any dysphagia and specifically states that she starts eating, chews the bite in her mouth and then spits it out as she does not want to swallow it even though she is hungry. She does not have early satiety, odynophagia. She states she loves to eat fruit and drink her favorite drinks. Uses marijuana as well. No N/V. Has hx of HIV. Is on Truvada.  Past Medical History:  Diagnosis Date  . Abnormal Pap smear of cervix 07/2015   needed Leep- did not get one  . Anxiety   . Depression   . GERD (gastroesophageal reflux disease)   . High cholesterol   . Hypertension     Past Surgical History:  Procedure Laterality Date  . CESAREAN SECTION    . PANCREAS BIOPSY     Abnormality, resulted to be non malignant  . SPLENECTOMY, TOTAL     Removal of spleen in order to obtain pancreatic biopsy  . stent leg    . TUBAL LIGATION      Prior to Admission medications   Medication Sig Start Date End Date Taking? Authorizing Provider  amLODipine (NORVASC) 10 MG tablet Take 1 tablet (10 mg total) by mouth daily. 02/02/17  Yes Karamalegos, Devonne Doughty, DO  aspirin EC 81 MG tablet Take by mouth.   Yes [provider]  atorvastatin (LIPITOR) 40 MG tablet Take 1 tablet (40 mg total) by mouth daily. 02/02/17  Yes Karamalegos, Devonne Doughty, DO  emtricitabine-tenofovir (TRUVADA) 200-300 MG tablet Take 1 tablet by mouth  daily. 10/04/17  Yes Karamalegos, Devonne Doughty, DO  megestrol (MEGACE) 20 MG tablet Take 1 tablet (20 mg total) daily by mouth. 10/13/17  Yes Karamalegos, Devonne Doughty, DO  mirabegron ER (MYRBETRIQ) 50 MG TB24 tablet Take 1 tablet (50 mg total) by mouth daily. 04/20/17  Yes MacDiarmid, Nicki Reaper, MD  Multiple Vitamins-Minerals (ONE-A-DAY WOMENS 50+ ADVANTAGE) TABS Take by mouth.   Yes [provider]  meloxicam (MOBIC) 15 MG tablet Take 1 tablet (15 mg total) by mouth daily. Patient not taking: Reported on 10/14/2017 07/28/17   Olin Hauser, DO  omeprazole (PRILOSEC) 20 MG capsule Take 1 capsule (20 mg total) by mouth daily before breakfast. Patient not taking: Reported on 10/14/2017 02/02/17   Olin Hauser, DO  ondansetron (ZOFRAN ODT) 4 MG disintegrating tablet Take 1 tablet (4 mg total) by mouth every 8 (eight) hours as needed for nausea or vomiting. Patient not taking: Reported on 10/14/2017 07/13/17   Olin Hauser, DO  Suvorexant 10 MG TABS Take 10 mg by mouth at bedtime as needed. Patient not taking: Reported on 10/14/2017 10/02/17   Olin Hauser, DO  tiZANidine (ZANAFLEX) 4 MG tablet TAKE 1 TABLET(4 MG) BY MOUTH EVERY 8 HOURS AS NEEDED FOR MUSCLE SPASMS Patient not taking: Reported on 10/14/2017 10/02/17   [name]  J, DO    Family History  Problem Relation Age of Onset  . Hypertension Mother   . Cancer Neg Hx   . Diabetes Neg Hx   . Ovarian cancer Neg Hx   . Heart disease Neg Hx      Social History   Tobacco Use  . Smoking status: Current Every Day Smoker    Packs/day: 1.00    Years: 25.00    Pack years: 25.00    Types: Cigarettes  . Smokeless tobacco: Current User  Substance Use Topics  . Alcohol use: No  . Drug use: Yes    Types: Marijuana    Comment: daily    Allergies as of 10/14/2017 - Review Complete 10/14/2017  Allergen Reaction Noted  . Acetaminophen Itching 01/16/2016  . Hydromorphone Other (See  Comments) 01/16/2016    Review of Systems:    All systems reviewed and negative except where noted in HPI.   Physical Exam:  Vital signs in last 24 hours: @VSRANGES @   General:   Pleasant, cooperative in NAD Head:  Normocephalic and atraumatic. Eyes:   No icterus.   Conjunctiva pink. PERRLA. Ears:  Normal auditory acuity. Neck:  Supple; no masses or thyroidomegaly Lungs: Respirations even and unlabored. Lungs clear to auscultation bilaterally.   No wheezes, crackles, or rhonchi.  Heart:  Regular rate and rhythm;  Without murmur, clicks, rubs or gallops Abdomen:  Soft, nondistended, nontender. Normal bowel sounds. No appreciable masses or hepatomegaly.  No rebound or guarding.  Rectal:  Not performed. Msk:  Symmetrical without gross deformities.  Strength 5/5 UE and LE b/l Extremities:  Without edema, cyanosis or clubbing. Neurologic:  Alert and oriented x3;  grossly normal neurologically. Skin:  Intact without significant lesions or rashes. Cervical Nodes:  No significant cervical adenopathy. Psych:  Alert and cooperative. Normal affect.  LAB RESULTS: No results for input(s): WBC, HGB, HCT, PLT in the last 72 hours. BMET No results for input(s): NA, K, CL, CO2, GLUCOSE, BUN, CREATININE, CALCIUM in the last 72 hours. LFT No results for input(s): PROT, ALBUMIN, AST, ALT, ALKPHOS, BILITOT, BILIDIR, IBILI in the last 72 hours. PT/INR No results for input(s): LABPROT, INR in the last 72 hours.  Labs reviewed STUDIES: No results found. CT scan and 2017 colonoscopy reviewed   Impression / Plan:   Peggy Russo is a 61 y.o. y/o female referred for unintentional weight loss  Documented weights reviewed and on March 2017 weight is listed at 166lb, today it is 161 lb, in Aug 2018 it was 165 lb.  She has no dysphagia, odynophagia, abdominal pain, N/V, altered bowel habits, blood in stool.   She has no alarm symptoms  On extensive discussion with her and her daughter her complaint is  that she spits out her food before even swallowing. This is not due to a organic GI pathology and is a behavioral issue. Her weight loss does not seem significant as per the documented weights as listed above. I encouraged her to swallow her food with her favorite juice or drink or soda. I encouraged her to find motivation to eat. She states there are no stressors that are preventing her from eating. Marijuana can alter appetite as well and she can try discontinuing this as well. Negative colonoscopy, CT scan and benign lab workup is reassuring. PCP can consider referral for Cognitive Behavior Therapy if she is unable to resolve her behavioral issues. Her daughter is also understanding of her problem and states "you just need to do  it..you just need to eat". Pt. Acknowledges that this is the problem as well.  Pt. Can contact us if symptoms change or if she develops dysphagia, odynophagia or a reason for concern. There is no thrush in her mouth to explain symptoms either. She uses stool softeners and even with daily BM and feeling sufficiently evacuated it doesn't change her appetite or eating habits.   Would recommend PCP to consider Psych/CBT referral and continue to encourage eating.   PCP to refer for surveillance colonoscopy after reviewing last colonoscopy results at appropriate interval.   Thank you for involving me in the care of this patient.     Virgel Manifold, MD  10/14/2017, 4:12 PM

## 2017-10-19 DIAGNOSIS — M5416 Radiculopathy, lumbar region: Secondary | ICD-10-CM | POA: Diagnosis not present

## 2017-10-19 DIAGNOSIS — M9903 Segmental and somatic dysfunction of lumbar region: Secondary | ICD-10-CM | POA: Diagnosis not present

## 2017-10-19 DIAGNOSIS — M5431 Sciatica, right side: Secondary | ICD-10-CM | POA: Diagnosis not present

## 2017-10-19 DIAGNOSIS — M9905 Segmental and somatic dysfunction of pelvic region: Secondary | ICD-10-CM | POA: Diagnosis not present

## 2017-10-22 DIAGNOSIS — M9905 Segmental and somatic dysfunction of pelvic region: Secondary | ICD-10-CM | POA: Diagnosis not present

## 2017-10-22 DIAGNOSIS — M9903 Segmental and somatic dysfunction of lumbar region: Secondary | ICD-10-CM | POA: Diagnosis not present

## 2017-10-22 DIAGNOSIS — M5416 Radiculopathy, lumbar region: Secondary | ICD-10-CM | POA: Diagnosis not present

## 2017-10-22 DIAGNOSIS — M5431 Sciatica, right side: Secondary | ICD-10-CM | POA: Diagnosis not present

## 2017-10-26 DIAGNOSIS — M9903 Segmental and somatic dysfunction of lumbar region: Secondary | ICD-10-CM | POA: Diagnosis not present

## 2017-10-26 DIAGNOSIS — M9905 Segmental and somatic dysfunction of pelvic region: Secondary | ICD-10-CM | POA: Diagnosis not present

## 2017-10-26 DIAGNOSIS — M5431 Sciatica, right side: Secondary | ICD-10-CM | POA: Diagnosis not present

## 2017-10-26 DIAGNOSIS — M5416 Radiculopathy, lumbar region: Secondary | ICD-10-CM | POA: Diagnosis not present

## 2017-10-28 DIAGNOSIS — M5416 Radiculopathy, lumbar region: Secondary | ICD-10-CM | POA: Diagnosis not present

## 2017-10-28 DIAGNOSIS — M9905 Segmental and somatic dysfunction of pelvic region: Secondary | ICD-10-CM | POA: Diagnosis not present

## 2017-10-28 DIAGNOSIS — M5431 Sciatica, right side: Secondary | ICD-10-CM | POA: Diagnosis not present

## 2017-10-28 DIAGNOSIS — M9903 Segmental and somatic dysfunction of lumbar region: Secondary | ICD-10-CM | POA: Diagnosis not present

## 2017-11-03 ENCOUNTER — Telehealth: Payer: Self-pay

## 2017-11-03 NOTE — Telephone Encounter (Signed)
Patient called reporting that she is having problems with Truvada.  She has no appetite and is losing weight.  She reports she will not be able to continue med.

## 2017-11-03 NOTE — Telephone Encounter (Signed)
She has had these symptoms for a long time now, even before starting Truvada, and has been referred to [facility] for these problems and they did not determine any cause of her loss of appetite. Determined to be behavioral.   These are not typical side effects of Truvada. It is her decision if she would like to stop the medicine. She will be at higher risk of HIV transmission off the medicine.  [name], DO [facility] 11/03/2017, 5:05 PM

## 2017-11-04 DIAGNOSIS — M5416 Radiculopathy, lumbar region: Secondary | ICD-10-CM | POA: Diagnosis not present

## 2017-11-04 DIAGNOSIS — M9905 Segmental and somatic dysfunction of pelvic region: Secondary | ICD-10-CM | POA: Diagnosis not present

## 2017-11-04 DIAGNOSIS — M9903 Segmental and somatic dysfunction of lumbar region: Secondary | ICD-10-CM | POA: Diagnosis not present

## 2017-11-04 DIAGNOSIS — M5431 Sciatica, right side: Secondary | ICD-10-CM | POA: Diagnosis not present

## 2017-11-05 NOTE — Telephone Encounter (Signed)
The pt was notified and she verbalize understanding, no questions or concerns.  

## 2017-11-11 ENCOUNTER — Encounter: Payer: Self-pay | Admitting: Emergency Medicine

## 2017-11-11 ENCOUNTER — Emergency Department
Admission: EM | Admit: 2017-11-11 | Discharge: 2017-11-11 | Disposition: A | Discharge status: Home / Self Care | Payer: Medicare Other | Attending: Emergency Medicine | Admitting: Emergency Medicine

## 2017-11-11 ENCOUNTER — Other Ambulatory Visit: Payer: Self-pay

## 2017-11-11 DIAGNOSIS — F17210 Nicotine dependence, cigarettes, uncomplicated: Secondary | ICD-10-CM | POA: Insufficient documentation

## 2017-11-11 DIAGNOSIS — I10 Essential (primary) hypertension: Secondary | ICD-10-CM | POA: Insufficient documentation

## 2017-11-11 DIAGNOSIS — Z79899 Other long term (current) drug therapy: Secondary | ICD-10-CM | POA: Diagnosis not present

## 2017-11-11 DIAGNOSIS — R531 Weakness: Secondary | ICD-10-CM | POA: Diagnosis not present

## 2017-11-11 DIAGNOSIS — M9905 Segmental and somatic dysfunction of pelvic region: Secondary | ICD-10-CM | POA: Diagnosis not present

## 2017-11-11 DIAGNOSIS — M9903 Segmental and somatic dysfunction of lumbar region: Secondary | ICD-10-CM | POA: Diagnosis not present

## 2017-11-11 DIAGNOSIS — M5416 Radiculopathy, lumbar region: Secondary | ICD-10-CM | POA: Diagnosis not present

## 2017-11-11 DIAGNOSIS — M5431 Sciatica, right side: Secondary | ICD-10-CM | POA: Diagnosis not present

## 2017-11-11 DIAGNOSIS — R5383 Other fatigue: Secondary | ICD-10-CM | POA: Diagnosis not present

## 2017-11-11 DIAGNOSIS — R110 Nausea: Secondary | ICD-10-CM

## 2017-11-11 DIAGNOSIS — Z7982 Long term (current) use of aspirin: Secondary | ICD-10-CM | POA: Insufficient documentation

## 2017-11-11 LAB — COMPREHENSIVE METABOLIC PANEL
ALBUMIN: 4.4 g/dL (ref 3.5–5.0)
ALT: 17 U/L (ref 14–54)
ANION GAP: 9 (ref 5–15)
AST: 23 U/L (ref 15–41)
Alkaline Phosphatase: 127 U/L — ABNORMAL HIGH (ref 38–126)
BUN: 13 mg/dL (ref 6–20)
CHLORIDE: 109 mmol/L (ref 101–111)
CO2: 23 mmol/L (ref 22–32)
Calcium: 9.6 mg/dL (ref 8.9–10.3)
Creatinine, Ser: 0.93 mg/dL (ref 0.44–1.00)
GFR calc non Af Amer: >60 mL/min (ref >60)
GLUCOSE: 104 mg/dL — AB (ref 65–99)
POTASSIUM: 3.8 mmol/L (ref 3.5–5.1)
SODIUM: 141 mmol/L (ref 135–145)
Total Bilirubin: 1.1 mg/dL (ref 0.3–1.2)
Total Protein: 8 g/dL (ref 6.5–8.1)

## 2017-11-11 LAB — CBC
HEMATOCRIT: 40.2 % (ref 35.0–47.0)
Hemoglobin: 12.8 g/dL (ref 12.0–16.0)
MCH: 23.7 pg — AB (ref 26.0–34.0)
MCHC: 31.9 g/dL — ABNORMAL LOW (ref 32.0–36.0)
MCV: 74.2 fL — AB (ref 80.0–100.0)
PLATELETS: 286 10*3/uL (ref 150–440)
RBC: 5.41 MIL/uL — AB (ref 3.80–5.20)
RDW: 15.7 % — AB (ref 11.5–14.5)
WBC: 15.7 10*3/uL — ABNORMAL HIGH (ref 3.6–11.0)

## 2017-11-11 LAB — URINALYSIS, COMPLETE (UACMP) WITH MICROSCOPIC
BILIRUBIN URINE: NEGATIVE
Bacteria, UA: NONE SEEN
Glucose, UA: NEGATIVE mg/dL
Ketones, ur: NEGATIVE mg/dL
Nitrite: NEGATIVE
PROTEIN: 30 mg/dL — AB
SPECIFIC GRAVITY, URINE: 1.018 (ref 1.005–1.030)
pH: 7 (ref 5.0–8.0)

## 2017-11-11 LAB — LIPASE, BLOOD: LIPASE: 28 U/L (ref 11–51)

## 2017-11-11 NOTE — ED Notes (Signed)
NAD noted at time of D/C. Pt denies questions or concerns. Pt ambulatory to the lobby at this time. MD aware of BP at time of D/C, states okay for D/C.

## 2017-11-11 NOTE — ED Triage Notes (Signed)
Says has nausea, unable tot eat.  Vomits after eating anything.  Has been seen by pcp and gi doctor for same and was given nausea med.  Says they told her it was psychiatric, but never referred her to psychiatrist.  Not vomiting right now.  EKG done nsr with occ. PVC.

## 2017-11-11 NOTE — Discharge Instructions (Signed)
Please seek medical attention for any high fevers, chest pain, shortness of breath, change in behavior, persistent vomiting, bloody stool or any other new or concerning symptoms.  

## 2017-11-12 NOTE — ED Provider Notes (Signed)
[facility] Emergency Department Provider Note  ____________________________________________   I have reviewed the triage vital signs and the nursing notes.   HISTORY  Chief Complaint Nausea   History limited by: Not Limited   HPI Peggy Russo is a 61 y.o. female who presents to the emergency department today because of concern for continued inability to eat.  DURATION:months TIMING: constant SEVERITY: severe QUALITY: patient states she spits food out CONTEXT: patient states that she has an appetite but when she puts food in her mouth. She chews it and then spits it out. She says that she is able to drink fluids. It appears she does not try to swallow the food.  MODIFYING FACTORS: none ASSOCIATED SYMPTOMS: states she has had weight loss. Has had fatigue.  Per medical record review patient saw GI roughly 1 month earlier for the same symptoms. At that time no obvious etiology of the patient's symptoms. Recommended psychiatric follow up which has not occurred.  Past Medical History:  Diagnosis Date  . Abnormal Pap smear of cervix 07/2015   needed Leep- did not get one  . Anxiety   . Depression   . GERD (gastroesophageal reflux disease)   . High cholesterol   . Hypertension     Patient Active Problem List   Diagnosis Date Noted  . Unintentional weight loss 10/13/2017  . Poor appetite 06/15/2017  . At risk for sexually transmitted disease due to partner with HIV 03/13/2017  . Degenerative lumbar spinal stenosis 03/13/2017  . Recurrent UTI (urinary tract infection) 02/12/2017  . Hyperlipidemia 02/02/2017  . Pre-diabetes 02/02/2017  . Chronic low back pain 12/10/2016  . Unstable bladder 12/10/2016  . Chronic constipation 02/26/2016  . Vitamin D deficiency 01/22/2016  . Insomnia 01/22/2016  . Arteriosclerosis of artery of extremity ([place]) 01/21/2016  . Nocturia 01/21/2016  . Extremity pain 09/02/2012  . Depression with anxiety 08/17/2012  . Acid  reflux 08/17/2012  . Abdominal pain, generalized 12/08/1898  . Cyst and pseudocyst of pancreas 12/08/1898  . Benign essential HTN 12/08/1898  . Geniculate herpes zoster 12/08/1898    Past Surgical History:  Procedure Laterality Date  . CESAREAN SECTION    . PANCREAS BIOPSY     Abnormality, resulted to be non malignant  . SPLENECTOMY, TOTAL     Removal of spleen in order to obtain pancreatic biopsy  . stent leg    . TUBAL LIGATION      Prior to Admission medications   Medication Sig Start Date End Date Taking? Authorizing Provider  amLODipine (NORVASC) 10 MG tablet Take 1 tablet (10 mg total) by mouth daily. 02/02/17   Karamalegos, Devonne Doughty, DO  aspirin EC 81 MG tablet Take by mouth.    [provider]  atorvastatin (LIPITOR) 40 MG tablet Take 1 tablet (40 mg total) by mouth daily. 02/02/17   Karamalegos, Devonne Doughty, DO  emtricitabine-tenofovir (TRUVADA) 200-300 MG tablet Take 1 tablet by mouth daily. 10/04/17   Karamalegos, Devonne Doughty, DO  megestrol (MEGACE) 20 MG tablet Take 1 tablet (20 mg total) daily by mouth. 10/13/17   Karamalegos, Devonne Doughty, DO  meloxicam (MOBIC) 15 MG tablet Take 1 tablet (15 mg total) by mouth daily. Patient not taking: Reported on 10/14/2017 07/28/17   Olin Hauser, DO  mirabegron ER (MYRBETRIQ) 50 MG TB24 tablet Take 1 tablet (50 mg total) by mouth daily. 04/20/17   Bjorn Loser, MD  Multiple Vitamins-Minerals (ONE-A-DAY WOMENS 50+ ADVANTAGE) TABS Take by mouth.    [provider]  omeprazole (PRILOSEC) 20 MG capsule Take 1 capsule (20 mg total) by mouth daily before breakfast. Patient not taking: Reported on 10/14/2017 02/02/17   Olin Hauser, DO  ondansetron (ZOFRAN ODT) 4 MG disintegrating tablet Take 1 tablet (4 mg total) by mouth every 8 (eight) hours as needed for nausea or vomiting. Patient not taking: Reported on 10/14/2017 07/13/17   Olin Hauser, DO  Suvorexant 10 MG TABS Take 10 mg by mouth  at bedtime as needed. Patient not taking: Reported on 10/14/2017 10/02/17   Olin Hauser, DO  tiZANidine (ZANAFLEX) 4 MG tablet TAKE 1 TABLET(4 MG) BY MOUTH EVERY 8 HOURS AS NEEDED FOR MUSCLE SPASMS Patient not taking: Reported on 10/14/2017 10/02/17   Olin Hauser, DO    Allergies Acetaminophen and Hydromorphone  Family History  Problem Relation Age of Onset  . Hypertension Mother   . Cancer Neg Hx   . Diabetes Neg Hx   . Ovarian cancer Neg Hx   . Heart disease Neg Hx     Social History Social History   Tobacco Use  . Smoking status: Current Every Day Smoker    Packs/day: 1.00    Years: 25.00    Pack years: 25.00    Types: Cigarettes  . Smokeless tobacco: Current User  Substance Use Topics  . Alcohol use: No  . Drug use: Yes    Types: Marijuana    Comment: daily    Review of Systems Constitutional: No fever/chills. Positive for fatigue and weakness. Eyes: No visual changes. ENT: No sore throat. Cardiovascular: Denies chest pain. Respiratory: Denies shortness of breath. Gastrointestinal: Positive for decreased solid intake.  Genitourinary: Negative for dysuria. Musculoskeletal: Negative for back pain. Skin: Negative for rash. Neurological: Negative for headaches, focal weakness or numbness.  ____________________________________________   PHYSICAL EXAM:  VITAL SIGNS: ED Triage Vitals [11/11/17 1029]  Enc Vitals Group     BP (!) 169/87     Pulse Rate 84     Resp 16     Temp 98.8 F (37.1 C)     Temp Source Oral     SpO2 98 %     Weight 140 lb (63.5 kg)     Height 5' 5"  (1.651 m)   Constitutional: Alert and oriented. Well appearing and in no distress. Eyes: Conjunctivae are normal.  ENT   Head: Normocephalic and atraumatic.   Nose: No congestion/rhinnorhea.   Mouth/Throat: Mucous membranes are moist.   Neck: No stridor. Hematological/Lymphatic/Immunilogical: No cervical lymphadenopathy. Cardiovascular: Normal  rate, regular rhythm.  No murmurs, rubs, or gallops. Respiratory: Normal respiratory effort without tachypnea nor retractions. Breath sounds are clear and equal bilaterally. No wheezes/rales/rhonchi. Gastrointestinal: Soft and non tender. No rebound. No guarding.  Genitourinary: Deferred Musculoskeletal: Normal range of motion in all extremities. No lower extremity edema. Neurologic:  Normal speech and language. No gross focal neurologic deficits are appreciated.  Skin:  Skin is warm, dry and intact. No rash noted. Psychiatric: Mood and affect are normal. Speech and behavior are normal. Patient exhibits appropriate insight and judgment.  ____________________________________________    LABS (pertinent positives/negatives)  CBC 15.7, hgb 12.8, plt 286 CMP glu 104, alk phos 127, otherwise wnl UA 6-30 wbc, rbc, squamous ____________________________________________   EKG  I, Nance Pear, attending physician, personally viewed and interpreted this EKG  EKG Time: 1023 Rate: 81 Rhythm: normal sinus rhythm Axis: normal Intervals: qtc 441 QRS: narrow ST changes: no st elevation Impression: normal ekg  ____________________________________________  RADIOLOGY  None  ____________________________________________   PROCEDURES  Procedures  ____________________________________________   INITIAL IMPRESSION / ASSESSMENT AND PLAN / ED COURSE  Pertinent labs & imaging results that were available during my care of the patient were reviewed by me and considered in my medical decision making (see chart for details).  Patient presents because of decreased solid food intake. At this point it is of unclear etiology. Blood work doesn't show concerning signs of malnutrition. Mild leukocytosis of unclear etiology. Urine with some wbcs but not clean sample and patient not complaining of urinary symptoms. Patient has already been evaluated by GI. Did reiterate that perhaps psychiatry would  be a good option at this time. Discussed importance of follow up.  ____________________________________________   FINAL CLINICAL IMPRESSION(S) / ED DIAGNOSES  Final diagnoses:  Nausea     Note: This dictation was prepared with Dragon dictation. Any transcriptional errors that result from this process are unintentional     Nance Pear, MD 11/12/17 [facility]

## 2017-11-13 DIAGNOSIS — F331 Major depressive disorder, recurrent, moderate: Secondary | ICD-10-CM | POA: Diagnosis not present

## 2017-11-24 DIAGNOSIS — F331 Major depressive disorder, recurrent, moderate: Secondary | ICD-10-CM | POA: Diagnosis not present

## 2017-11-25 DIAGNOSIS — M5431 Sciatica, right side: Secondary | ICD-10-CM | POA: Diagnosis not present

## 2017-11-25 DIAGNOSIS — M5416 Radiculopathy, lumbar region: Secondary | ICD-10-CM | POA: Diagnosis not present

## 2017-11-25 DIAGNOSIS — M9903 Segmental and somatic dysfunction of lumbar region: Secondary | ICD-10-CM | POA: Diagnosis not present

## 2017-11-25 DIAGNOSIS — M9905 Segmental and somatic dysfunction of pelvic region: Secondary | ICD-10-CM | POA: Diagnosis not present

## 2017-12-06 DIAGNOSIS — R1032 Left lower quadrant pain: Secondary | ICD-10-CM | POA: Diagnosis not present

## 2017-12-06 DIAGNOSIS — Z7982 Long term (current) use of aspirin: Secondary | ICD-10-CM | POA: Diagnosis not present

## 2017-12-06 DIAGNOSIS — K219 Gastro-esophageal reflux disease without esophagitis: Secondary | ICD-10-CM | POA: Diagnosis not present

## 2017-12-06 DIAGNOSIS — Z7902 Long term (current) use of antithrombotics/antiplatelets: Secondary | ICD-10-CM | POA: Diagnosis not present

## 2017-12-06 DIAGNOSIS — Z79899 Other long term (current) drug therapy: Secondary | ICD-10-CM | POA: Diagnosis not present

## 2017-12-06 DIAGNOSIS — I10 Essential (primary) hypertension: Secondary | ICD-10-CM | POA: Diagnosis not present

## 2017-12-06 DIAGNOSIS — E7800 Pure hypercholesterolemia, unspecified: Secondary | ICD-10-CM | POA: Diagnosis not present

## 2017-12-06 DIAGNOSIS — N390 Urinary tract infection, site not specified: Secondary | ICD-10-CM | POA: Diagnosis not present

## 2017-12-06 DIAGNOSIS — F17210 Nicotine dependence, cigarettes, uncomplicated: Secondary | ICD-10-CM | POA: Diagnosis not present

## 2017-12-06 DIAGNOSIS — N200 Calculus of kidney: Secondary | ICD-10-CM | POA: Diagnosis not present

## 2017-12-06 DIAGNOSIS — M546 Pain in thoracic spine: Secondary | ICD-10-CM | POA: Diagnosis not present

## 2017-12-22 DIAGNOSIS — F331 Major depressive disorder, recurrent, moderate: Secondary | ICD-10-CM | POA: Diagnosis not present

## 2017-12-26 ENCOUNTER — Other Ambulatory Visit: Payer: Self-pay | Admitting: Family Medicine

## 2017-12-26 DIAGNOSIS — R634 Abnormal weight loss: Secondary | ICD-10-CM

## 2017-12-26 DIAGNOSIS — R630 Anorexia: Secondary | ICD-10-CM

## 2017-12-29 ENCOUNTER — Other Ambulatory Visit: Payer: Medicare Other

## 2017-12-29 DIAGNOSIS — Z9189 Other specified personal risk factors, not elsewhere classified: Secondary | ICD-10-CM | POA: Diagnosis not present

## 2017-12-29 DIAGNOSIS — Z79899 Other long term (current) drug therapy: Secondary | ICD-10-CM | POA: Diagnosis not present

## 2017-12-29 DIAGNOSIS — Z114 Encounter for screening for human immunodeficiency virus [HIV]: Secondary | ICD-10-CM

## 2017-12-29 DIAGNOSIS — R7303 Prediabetes: Secondary | ICD-10-CM | POA: Diagnosis not present

## 2017-12-29 DIAGNOSIS — Z7251 High risk heterosexual behavior: Secondary | ICD-10-CM

## 2017-12-29 DIAGNOSIS — Z206 Contact with and (suspected) exposure to human immunodeficiency virus [HIV]: Secondary | ICD-10-CM | POA: Diagnosis not present

## 2017-12-30 LAB — COMPLETE METABOLIC PANEL WITH GFR
AG Ratio: 1.5 (calc) (ref 1.0–2.5)
ALKALINE PHOSPHATASE (APISO): 106 U/L (ref 33–130)
ALT: 11 U/L (ref 6–29)
AST: 14 U/L (ref 10–35)
Albumin: 4.2 g/dL (ref 3.6–5.1)
BUN/Creatinine Ratio: 12 (calc) (ref 6–22)
BUN: 14 mg/dL (ref 7–25)
CHLORIDE: 107 mmol/L (ref 98–110)
CO2: 27 mmol/L (ref 20–32)
CREATININE: 1.15 mg/dL — AB (ref 0.50–0.99)
Calcium: 9.5 mg/dL (ref 8.6–10.4)
GFR, Est African American: 60 mL/min/{1.73_m2} (ref >=60)
GFR, Est Non African American: 52 mL/min/{1.73_m2} — ABNORMAL LOW (ref >=60)
GLOBULIN: 2.8 g/dL (ref 1.9–3.7)
Glucose, Bld: 91 mg/dL (ref 65–99)
POTASSIUM: 3.9 mmol/L (ref 3.5–5.3)
SODIUM: 141 mmol/L (ref 135–146)
Total Bilirubin: 0.3 mg/dL (ref 0.2–1.2)
Total Protein: 7 g/dL (ref 6.1–8.1)

## 2017-12-30 LAB — HIV ANTIBODY (ROUTINE TESTING W REFLEX): HIV 1&2 Ab, 4th Generation: NONREACTIVE

## 2017-12-30 LAB — HEMOGLOBIN A1C
HEMOGLOBIN A1C: 5.8 %{Hb} — AB (ref <5.7)
MEAN PLASMA GLUCOSE: 120 (calc)
eAG (mmol/L): 6.6 (calc)

## 2017-12-31 DIAGNOSIS — F331 Major depressive disorder, recurrent, moderate: Secondary | ICD-10-CM | POA: Diagnosis not present

## 2018-01-01 ENCOUNTER — Encounter: Payer: Self-pay | Admitting: Family Medicine

## 2018-01-01 ENCOUNTER — Ambulatory Visit (INDEPENDENT_AMBULATORY_CARE_PROVIDER_SITE_OTHER): Payer: Medicare Other | Admitting: Family Medicine

## 2018-01-01 VITALS — BP 129/78 | HR 78 | Temp 98.3°F | Resp 16 | Ht 65.0 in | Wt 153.0 lb

## 2018-01-01 DIAGNOSIS — Z7251 High risk heterosexual behavior: Secondary | ICD-10-CM

## 2018-01-01 DIAGNOSIS — R7303 Prediabetes: Secondary | ICD-10-CM | POA: Diagnosis not present

## 2018-01-01 DIAGNOSIS — F5101 Primary insomnia: Secondary | ICD-10-CM | POA: Diagnosis not present

## 2018-01-01 DIAGNOSIS — Z9189 Other specified personal risk factors, not elsewhere classified: Secondary | ICD-10-CM | POA: Diagnosis not present

## 2018-01-01 DIAGNOSIS — R630 Anorexia: Secondary | ICD-10-CM

## 2018-01-01 DIAGNOSIS — R7989 Other specified abnormal findings of blood chemistry: Secondary | ICD-10-CM | POA: Diagnosis not present

## 2018-01-01 DIAGNOSIS — Z206 Contact with and (suspected) exposure to human immunodeficiency virus [HIV]: Secondary | ICD-10-CM

## 2018-01-01 DIAGNOSIS — R634 Abnormal weight loss: Secondary | ICD-10-CM

## 2018-01-01 NOTE — Assessment & Plan Note (Signed)
HIV negative screening Will order future labs for 3 months

## 2018-01-01 NOTE — Assessment & Plan Note (Addendum)
Improved some, but still a persistent problem Seems related to psychological/cognitive vs appetite Has had work-up and seen GI, no other organic etiology identified Now established with [facility], back on Remeron for appetite and will follow with them No further changes Encouraged continue to improve appropriate foods in diet, gradually adjust

## 2018-01-01 NOTE — Assessment & Plan Note (Signed)
Stable, controlled A1c 5.8 from 5.9 Counseling on monitoring A1c, improve diet, avoid excess sugar/carbs, now focus is still on avoiding wt loss

## 2018-01-01 NOTE — Assessment & Plan Note (Signed)
Mild elevated Cr up to 1.15, in past 0.9 to 1 On Truvada in past, stopped 2 months ago Suspect may be related to hydration, overall GFR still >60, possibly CKD-II Monitor BMET Cr trend

## 2018-01-01 NOTE — Progress Notes (Signed)
Subjective:    Patient ID: Peggy Russo, female    DOB: 03/05/1956, 62 y.o.   MRN: [id number]  Peggy Russo is a 62 y.o. female presenting on 01/01/2018 for Insomnia   HPI   FOLLOW-UP Depression / Insomnia / Reduced Appetite - Last visits with me 09/2017 and 10/13/17, for same problem, treated with switch off Duloxetine and Ambien to Belsomra (Suvorexant) QHS PRN and recently for appetite was referred to [facility] and trial of Megace, see prior notes for background information. - Interval update she was seen on 10/14/17 by Dr Bonna Gains at [facility] and reviewed their note, determined that it seems to be more psychological with spitting food out before swallowing, and more refusal to eat, appetite affected possibly by marijuana, has had negative imaging and testing in past. Recommended Psychology/CBT. - Other updates, patient was seen in ED 12/5 for nausea and same problem, again same recommendation, labs were normal - Today patient states she was recently seen by [facility] as per other doctors advice, states seen by them on 12/31/17 and they resumed the Remeron 30mg  nightly and added Hydroxyzine QHS PRN insomnia. She has discussed the appetite with them as well, difficult to explain if anything has changed - Regarding weight, now gained few lbs+13 since last visit, seems improved, not at goal of 170 yet - Regarding insomnia, still taking Belsomra most nights, doing alright with it but still seems to wake up and unable to go back to sleep - Failed multiple medications in past including Trazodone, Celexa, Mirtazapine, Elavil, Duloxetine, Ambien - Never had PSG - Denies any symptoms of OSA (no excessive daytime fatigue or sleepiness, snoring, apnea episodes) - Denies dysphagia, vomiting, abdominal pain, dark stools, suicidal or homicidal ideation, anxiety, panic attacks, urinary frequency, unintentional weight loss  Elevated Creatinine She has been monitored for labs in past, with now some slightly inc Creatinine 1.15 on  last check, was on Truvada in past due to her husband/partner has HIV. She has been getting q 3 month HIV screening. Most recent was negative.  Pre-DM A1c now 5.8 from 5.9 Has improved diet overall but still not adhering to any particular diet, goal to gain some weight per her and avoid wt loss  Additionally - She had recent infected tooth cavity, went to dentist, given Amoxicillin, broke out in hives allergic reaction, this resolved after few days, added to allergy list  Depression screen [facility] 2/9 01/01/2018 10/02/2017 07/13/2017  Decreased Interest 1 0 2  Down, Depressed, Hopeless 0 0 0  PHQ - 2 Score 1 0 2  Altered sleeping 3 3 3   Tired, decreased energy 3 0 2  Change in appetite 3 2 3   Feeling bad or failure about yourself  0 0 0  Trouble concentrating 0 0 1  Moving slowly or fidgety/restless 0 0 0  Suicidal thoughts 0 0 0  PHQ-9 Score 10 5 11   Difficult doing work/chores Not difficult at all Not difficult at all -    Social History   Tobacco Use  . Smoking status: Current Every Day Smoker    Packs/day: 1.00    Years: 25.00    Pack years: 25.00    Types: Cigarettes  . Smokeless tobacco: Current User  Substance Use Topics  . Alcohol use: No  . Drug use: Yes    Types: Marijuana    Comment: daily    Review of Systems Per HPI unless specifically indicated above     Objective:    BP 129/78   Pulse  78   Temp 98.3 F (36.8 C) (Oral)   Resp 16   Ht 5\' 5"  (1.651 m)   Wt 153 lb (69.4 kg)   BMI 25.46 kg/m   Wt Readings from Last 3 Encounters:  01/01/18 153 lb (69.4 kg)  11/11/17 140 lb (63.5 kg)  10/14/17 161 lb 9.6 oz (73.3 kg)    Physical Exam  Constitutional: She is oriented to person, place, and time. She appears well-developed and well-nourished. No distress.  Well-appearing, comfortable, cooperative, some weight gain  HENT:  Head: Normocephalic and atraumatic.  Mouth/Throat: Oropharynx is clear and moist.  Oropharynx clear without erythema, exudates, edema  or asymmetry. Some poor dentition  Eyes: Conjunctivae are normal. Right eye exhibits no discharge. Left eye exhibits no discharge.  Neck: Normal range of motion. Neck supple.  Cardiovascular: Normal rate, regular rhythm, normal heart sounds and intact distal pulses.  No murmur heard. Pulmonary/Chest: Effort normal and breath sounds normal. No respiratory distress. She has no wheezes. She has no rales.  Abdominal: Soft. Bowel sounds are normal. She exhibits no distension. There is no tenderness. There is no rebound.  Musculoskeletal: She exhibits no edema.  Neurological: She is alert and oriented to person, place, and time.  Skin: Skin is warm and dry. No rash noted. She is not diaphoretic. No erythema.  Psychiatric: She has a normal mood and affect. Her behavior is normal.  Well groomed, good eye contact, normal speech and thoughts  Nursing note and vitals reviewed.  Results for orders placed or performed in visit on 12/29/17  HIV antibody  Result Value Ref Range   HIV 1&2 Ab, 4th Generation NON-REACTIVE NON-REACTI  Hemoglobin A1c  Result Value Ref Range   Hgb A1c MFr Bld 5.8 (H) <5.7 % of total Hgb   Mean Plasma Glucose 120 (calc)   eAG (mmol/L) 6.6 (calc)  COMPLETE METABOLIC PANEL WITH GFR  Result Value Ref Range   Glucose, Bld 91 65 - 99 mg/dL   BUN 14 7 - 25 mg/dL   Creat 1.15 (H) 0.50 - 0.99 mg/dL   GFR, Est Non African American 52 (L) > OR = 60 mL/min/1.65m2   GFR, Est African American 60 > OR = 60 mL/min/1.76m2   BUN/Creatinine Ratio 12 6 - 22 (calc)   Sodium 141 135 - 146 mmol/L   Potassium 3.9 3.5 - 5.3 mmol/L   Chloride 107 98 - 110 mmol/L   CO2 27 20 - 32 mmol/L   Calcium 9.5 8.6 - 10.4 mg/dL   Total Protein 7.0 6.1 - 8.1 g/dL   Albumin 4.2 3.6 - 5.1 g/dL   Globulin 2.8 1.9 - 3.7 g/dL (calc)   AG Ratio 1.5 1.0 - 2.5 (calc)   Total Bilirubin 0.3 0.2 - 1.2 mg/dL   Alkaline phosphatase (APISO) 106 33 - 130 U/L   AST 14 10 - 35 U/L   ALT 11 6 - 29 U/L        Assessment & Plan:   Problem List Items Addressed This Visit    At risk for sexually transmitted disease due to partner with HIV    HIV negative screening Will order future labs for 3 months      Relevant Orders   HIV antibody   Elevated serum creatinine    Mild elevated Cr up to 1.15, in past 0.9 to 1 On Truvada in past, stopped 2 months ago Suspect may be related to hydration, overall GFR still >60, possibly CKD-II Monitor BMET Cr trend  Relevant Orders   BASIC METABOLIC PANEL WITH GFR   Insomnia    Persistent insomnia with limited benefit - Chronic problem with inaccurate expectations of medicine to completely resolve sleeping problem - Failed Sertraline, Celexa, Mirtazapine, Trazodone, Ambien, Elavil, Duloxetine  Plan: 1. Continue Belsomra (Suvorexant 10mg ) nightly PRN - will continue for longer for now 2. Follow with [facility] - has resumed Remeron 3. Follow-up in future may need [place] Controlled Contract if continues med - lastly offered that if this med is not helping I would recommend referral to sleep specialist and possible PSG, no symptoms of OSA      Poor appetite - Primary    Improved some, but still a persistent problem Seems related to psychological/cognitive vs appetite Has had work-up and seen GI, no other organic etiology identified Now established with [facility], back on Remeron for appetite and will follow with them No further changes Encouraged continue to improve appropriate foods in diet, gradually adjust      Pre-diabetes    Stable, controlled A1c 5.8 from 5.9 Counseling on monitoring A1c, improve diet, avoid excess sugar/carbs, now focus is still on avoiding wt loss      RESOLVED: Unintentional weight loss    Improved now, with 13 lb wt gain, seems appetite related still. No longer considered unintentional Resolved       Other Visit Diagnoses    HIV exposure       Relevant Orders   HIV antibody   High risk heterosexual behavior       Relevant  Orders   HIV antibody      No orders of the defined types were placed in this encounter.     Follow up plan: Return in about 3 months (around 04/01/2018) for Insomnia / Mood PHQ / Appetite/Wt.  Future orders for 3 months.  [name], [facility] 01/01/2018, 12:37 PM

## 2018-01-01 NOTE — Assessment & Plan Note (Signed)
Persistent insomnia with limited benefit - Chronic problem with inaccurate expectations of medicine to completely resolve sleeping problem - Failed Sertraline, Celexa, Mirtazapine, Trazodone, Ambien, Elavil, Duloxetine  Plan: 1. Continue Belsomra (Suvorexant 10mg ) nightly PRN - will continue for longer for now 2. Follow with [facility] - has resumed Remeron 3. Follow-up in future may need [place] Controlled Contract if continues med - lastly offered that if this med is not helping I would recommend referral to sleep specialist and possible PSG, no symptoms of OSA

## 2018-01-01 NOTE — Assessment & Plan Note (Signed)
Improved now, with 13 lb wt gain, seems appetite related still. No longer considered unintentional Resolved

## 2018-01-01 NOTE — Patient Instructions (Addendum)
Thank you for coming to the office today.  1. Keep up the good work with [facility] and follow with them for your med adjustments for both insomnia and appetite  2. May continue Belsomra for now, let me know if need refill  3. Labs with elevated Creatinine kidney function, likely from Truvada, will recheck again in future  HIV was negative  A1c 5.8 improved from 5.9  Please schedule a Follow-up Appointment to: Return in about 3 months (around 04/01/2018) for Insomnia / Mood PHQ / Appetite/Wt.  If you have any other questions or concerns, please feel free to call the office or send a message through [facility]. You may also schedule an earlier appointment if necessary.  Additionally, you may be receiving a survey about your experience at our office within a few days to 1 week by e-mail or mail. We value your feedback.  [name], DO [place]

## 2018-01-05 ENCOUNTER — Ambulatory Visit: Payer: Medicare Other | Admitting: Family Medicine

## 2018-01-06 ENCOUNTER — Ambulatory Visit: Payer: Medicare Other | Admitting: Family Medicine

## 2018-02-12 DIAGNOSIS — F331 Major depressive disorder, recurrent, moderate: Secondary | ICD-10-CM | POA: Diagnosis not present

## 2018-03-11 DIAGNOSIS — I70213 Atherosclerosis of native arteries of extremities with intermittent claudication, bilateral legs: Secondary | ICD-10-CM | POA: Diagnosis not present

## 2018-03-22 ENCOUNTER — Telehealth: Payer: Self-pay | Admitting: Family Medicine

## 2018-03-22 NOTE — Telephone Encounter (Signed)
LM about scheduling [facility] after 06/10/18 since she is MCR.

## 2018-03-25 ENCOUNTER — Other Ambulatory Visit: Payer: Self-pay

## 2018-03-25 DIAGNOSIS — Z206 Contact with and (suspected) exposure to human immunodeficiency virus [HIV]: Secondary | ICD-10-CM

## 2018-03-25 DIAGNOSIS — Z9189 Other specified personal risk factors, not elsewhere classified: Secondary | ICD-10-CM

## 2018-03-25 DIAGNOSIS — R7989 Other specified abnormal findings of blood chemistry: Secondary | ICD-10-CM

## 2018-03-25 DIAGNOSIS — Z7251 High risk heterosexual behavior: Secondary | ICD-10-CM

## 2018-03-26 ENCOUNTER — Other Ambulatory Visit: Payer: Medicare Other

## 2018-03-26 ENCOUNTER — Other Ambulatory Visit: Payer: Self-pay | Admitting: Family Medicine

## 2018-03-26 DIAGNOSIS — K219 Gastro-esophageal reflux disease without esophagitis: Secondary | ICD-10-CM

## 2018-03-26 DIAGNOSIS — I10 Essential (primary) hypertension: Secondary | ICD-10-CM

## 2018-03-31 ENCOUNTER — Ambulatory Visit: Payer: Medicare Other | Admitting: Family Medicine

## 2018-04-02 ENCOUNTER — Ambulatory Visit: Payer: Medicare Other | Admitting: Family Medicine

## 2018-04-19 ENCOUNTER — Telehealth: Payer: Self-pay | Admitting: Family Medicine

## 2018-04-19 DIAGNOSIS — K219 Gastro-esophageal reflux disease without esophagitis: Secondary | ICD-10-CM

## 2018-04-19 DIAGNOSIS — R630 Anorexia: Secondary | ICD-10-CM

## 2018-04-19 DIAGNOSIS — G4700 Insomnia, unspecified: Secondary | ICD-10-CM

## 2018-04-19 DIAGNOSIS — F5101 Primary insomnia: Secondary | ICD-10-CM

## 2018-04-19 DIAGNOSIS — G8929 Other chronic pain: Secondary | ICD-10-CM

## 2018-04-19 DIAGNOSIS — I10 Essential (primary) hypertension: Secondary | ICD-10-CM

## 2018-04-19 DIAGNOSIS — M5441 Lumbago with sciatica, right side: Secondary | ICD-10-CM

## 2018-04-19 DIAGNOSIS — R110 Nausea: Secondary | ICD-10-CM

## 2018-04-19 DIAGNOSIS — I70209 Unspecified atherosclerosis of native arteries of extremities, unspecified extremity: Secondary | ICD-10-CM

## 2018-04-19 MED ORDER — ATORVASTATIN CALCIUM 40 MG PO TABS: 40.0000 mg | ORAL_TABLET | Freq: Every day | ORAL | 0 refills | Status: AC

## 2018-04-19 MED ORDER — TIZANIDINE HCL 4 MG PO TABS: ORAL_TABLET | ORAL | 0 refills | Status: AC

## 2018-04-19 MED ORDER — AMLODIPINE BESYLATE 10 MG PO TABS: 10.0000 mg | ORAL_TABLET | Freq: Every day | ORAL | 0 refills | Status: AC

## 2018-04-19 MED ORDER — MELOXICAM 15 MG PO TABS: 15.0000 mg | ORAL_TABLET | Freq: Every day | ORAL | 2 refills | Status: AC

## 2018-04-19 MED ORDER — SUVOREXANT 10 MG PO TABS: 10.0000 mg | ORAL_TABLET | Freq: Every evening | ORAL | 0 refills | Status: AC | PRN

## 2018-04-19 MED ORDER — HYDROXYZINE HCL 25 MG PO TABS: 25.0000 mg | ORAL_TABLET | Freq: Every evening | ORAL | 2 refills | Status: AC | PRN

## 2018-04-19 MED ORDER — MIRTAZAPINE 30 MG PO TABS
30.0000 mg | ORAL_TABLET | Freq: Every day | ORAL | 0 refills | Status: AC
Start: 2018-04-19 — End: ?

## 2018-04-19 MED ORDER — ONDANSETRON 4 MG PO TBDP
4.0000 mg | ORAL_TABLET | Freq: Three times a day (TID) | ORAL | 2 refills | Status: AC | PRN
Start: 2018-04-19 — End: ?

## 2018-04-19 MED ORDER — OMEPRAZOLE 20 MG PO CPDR: 20.0000 mg | DELAYED_RELEASE_CAPSULE | Freq: Every day | ORAL | 0 refills | Status: AC

## 2018-04-19 NOTE — Telephone Encounter (Signed)
Last visit with me 12/2017. She has now notified us that she has moved to [facility] and will transfer care to new PCP.  I will provide rx refills for up to 3 months while she locates new doctor.  All rx written for approx 90 day supply  [name], DO [facility] 04/19/2018, 12:09 PM

## 2018-04-19 NOTE — Telephone Encounter (Signed)
Pt has moved to [facility] and has not found a new doctor.  She asked for refill on her medications until she can establish care in [place].  She will be using [facility] on [address] in [place].  Her call back number is [phone]

## 2018-05-06 DIAGNOSIS — Z7251 High risk heterosexual behavior: Secondary | ICD-10-CM | POA: Diagnosis not present

## 2018-05-06 DIAGNOSIS — Z9081 Acquired absence of spleen: Secondary | ICD-10-CM | POA: Diagnosis not present

## 2018-05-12 DIAGNOSIS — N3000 Acute cystitis without hematuria: Secondary | ICD-10-CM | POA: Diagnosis not present

## 2018-05-12 DIAGNOSIS — K219 Gastro-esophageal reflux disease without esophagitis: Secondary | ICD-10-CM | POA: Diagnosis not present

## 2018-05-12 DIAGNOSIS — G629 Polyneuropathy, unspecified: Secondary | ICD-10-CM | POA: Diagnosis not present

## 2018-05-12 DIAGNOSIS — Z7982 Long term (current) use of aspirin: Secondary | ICD-10-CM | POA: Diagnosis not present

## 2018-05-12 DIAGNOSIS — F329 Major depressive disorder, single episode, unspecified: Secondary | ICD-10-CM | POA: Diagnosis not present

## 2018-05-12 DIAGNOSIS — Z886 Allergy status to analgesic agent status: Secondary | ICD-10-CM | POA: Diagnosis not present

## 2018-05-12 DIAGNOSIS — Z9889 Other specified postprocedural states: Secondary | ICD-10-CM | POA: Diagnosis not present

## 2018-05-12 DIAGNOSIS — N3281 Overactive bladder: Secondary | ICD-10-CM | POA: Diagnosis not present

## 2018-05-12 DIAGNOSIS — E7800 Pure hypercholesterolemia, unspecified: Secondary | ICD-10-CM | POA: Diagnosis not present

## 2018-05-12 DIAGNOSIS — Z880 Allergy status to penicillin: Secondary | ICD-10-CM | POA: Diagnosis not present

## 2018-05-12 DIAGNOSIS — F17210 Nicotine dependence, cigarettes, uncomplicated: Secondary | ICD-10-CM | POA: Diagnosis not present

## 2018-05-12 DIAGNOSIS — I10 Essential (primary) hypertension: Secondary | ICD-10-CM | POA: Diagnosis not present

## 2018-05-12 DIAGNOSIS — Z79899 Other long term (current) drug therapy: Secondary | ICD-10-CM | POA: Diagnosis not present

## 2018-05-12 DIAGNOSIS — R531 Weakness: Secondary | ICD-10-CM | POA: Diagnosis not present

## 2018-05-12 DIAGNOSIS — Z888 Allergy status to other drugs, medicaments and biological substances status: Secondary | ICD-10-CM | POA: Diagnosis not present

## 2018-05-12 DIAGNOSIS — F419 Anxiety disorder, unspecified: Secondary | ICD-10-CM | POA: Diagnosis not present

## 2018-05-12 DIAGNOSIS — R5381 Other malaise: Secondary | ICD-10-CM | POA: Diagnosis not present

## 2018-05-12 DIAGNOSIS — R110 Nausea: Secondary | ICD-10-CM | POA: Diagnosis not present

## 2018-05-12 DIAGNOSIS — Z87440 Personal history of urinary (tract) infections: Secondary | ICD-10-CM | POA: Diagnosis not present

## 2018-05-12 DIAGNOSIS — Z955 Presence of coronary angioplasty implant and graft: Secondary | ICD-10-CM | POA: Diagnosis not present

## 2018-05-12 DIAGNOSIS — R42 Dizziness and giddiness: Secondary | ICD-10-CM | POA: Diagnosis not present

## 2018-05-12 DIAGNOSIS — Z87442 Personal history of urinary calculi: Secondary | ICD-10-CM | POA: Diagnosis not present

## 2018-05-12 DIAGNOSIS — R630 Anorexia: Secondary | ICD-10-CM | POA: Diagnosis not present

## 2018-05-20 DIAGNOSIS — Z886 Allergy status to analgesic agent status: Secondary | ICD-10-CM | POA: Diagnosis not present

## 2018-05-20 DIAGNOSIS — F419 Anxiety disorder, unspecified: Secondary | ICD-10-CM | POA: Diagnosis not present

## 2018-05-20 DIAGNOSIS — Z7982 Long term (current) use of aspirin: Secondary | ICD-10-CM | POA: Diagnosis not present

## 2018-05-20 DIAGNOSIS — F329 Major depressive disorder, single episode, unspecified: Secondary | ICD-10-CM | POA: Diagnosis not present

## 2018-05-20 DIAGNOSIS — Z885 Allergy status to narcotic agent status: Secondary | ICD-10-CM | POA: Diagnosis not present

## 2018-05-20 DIAGNOSIS — M542 Cervicalgia: Secondary | ICD-10-CM | POA: Diagnosis not present

## 2018-05-20 DIAGNOSIS — M47812 Spondylosis without myelopathy or radiculopathy, cervical region: Secondary | ICD-10-CM | POA: Diagnosis not present

## 2018-05-20 DIAGNOSIS — R51 Headache: Secondary | ICD-10-CM | POA: Diagnosis not present

## 2018-05-20 DIAGNOSIS — I10 Essential (primary) hypertension: Secondary | ICD-10-CM | POA: Diagnosis not present

## 2018-05-20 DIAGNOSIS — I739 Peripheral vascular disease, unspecified: Secondary | ICD-10-CM | POA: Diagnosis not present

## 2018-05-20 DIAGNOSIS — G629 Polyneuropathy, unspecified: Secondary | ICD-10-CM | POA: Diagnosis not present

## 2018-05-20 DIAGNOSIS — M4802 Spinal stenosis, cervical region: Secondary | ICD-10-CM | POA: Diagnosis not present

## 2018-05-20 DIAGNOSIS — I2510 Atherosclerotic heart disease of native coronary artery without angina pectoris: Secondary | ICD-10-CM | POA: Diagnosis not present

## 2018-05-20 DIAGNOSIS — Z794 Long term (current) use of insulin: Secondary | ICD-10-CM | POA: Diagnosis not present

## 2018-05-20 DIAGNOSIS — F17210 Nicotine dependence, cigarettes, uncomplicated: Secondary | ICD-10-CM | POA: Diagnosis not present

## 2018-05-20 DIAGNOSIS — Z87440 Personal history of urinary (tract) infections: Secondary | ICD-10-CM | POA: Diagnosis not present

## 2018-05-20 DIAGNOSIS — Z888 Allergy status to other drugs, medicaments and biological substances status: Secondary | ICD-10-CM | POA: Diagnosis not present

## 2018-05-20 DIAGNOSIS — K219 Gastro-esophageal reflux disease without esophagitis: Secondary | ICD-10-CM | POA: Diagnosis not present

## 2018-05-20 DIAGNOSIS — Z880 Allergy status to penicillin: Secondary | ICD-10-CM | POA: Diagnosis not present

## 2018-05-20 DIAGNOSIS — E7800 Pure hypercholesterolemia, unspecified: Secondary | ICD-10-CM | POA: Diagnosis not present

## 2018-07-09 DIAGNOSIS — R634 Abnormal weight loss: Secondary | ICD-10-CM | POA: Diagnosis not present

## 2018-07-09 DIAGNOSIS — E669 Obesity, unspecified: Secondary | ICD-10-CM | POA: Diagnosis not present

## 2018-07-09 DIAGNOSIS — R7309 Other abnormal glucose: Secondary | ICD-10-CM | POA: Diagnosis not present

## 2018-07-09 DIAGNOSIS — R630 Anorexia: Secondary | ICD-10-CM | POA: Diagnosis not present

## 2018-07-09 DIAGNOSIS — I10 Essential (primary) hypertension: Secondary | ICD-10-CM | POA: Diagnosis not present

## 2018-08-03 DIAGNOSIS — N898 Other specified noninflammatory disorders of vagina: Secondary | ICD-10-CM | POA: Diagnosis not present

## 2018-08-03 DIAGNOSIS — N390 Urinary tract infection, site not specified: Secondary | ICD-10-CM | POA: Diagnosis not present

## 2018-08-17 DIAGNOSIS — R300 Dysuria: Secondary | ICD-10-CM | POA: Diagnosis not present

## 2018-08-17 DIAGNOSIS — N390 Urinary tract infection, site not specified: Secondary | ICD-10-CM | POA: Diagnosis not present

## 2018-08-17 DIAGNOSIS — M545 Low back pain: Secondary | ICD-10-CM | POA: Diagnosis not present

## 2018-08-17 DIAGNOSIS — N898 Other specified noninflammatory disorders of vagina: Secondary | ICD-10-CM | POA: Diagnosis not present

## 2018-08-26 DIAGNOSIS — I70209 Unspecified atherosclerosis of native arteries of extremities, unspecified extremity: Secondary | ICD-10-CM | POA: Diagnosis not present

## 2018-09-20 DIAGNOSIS — F17210 Nicotine dependence, cigarettes, uncomplicated: Secondary | ICD-10-CM | POA: Diagnosis not present

## 2018-09-20 DIAGNOSIS — K219 Gastro-esophageal reflux disease without esophagitis: Secondary | ICD-10-CM | POA: Diagnosis not present

## 2018-09-20 DIAGNOSIS — N3001 Acute cystitis with hematuria: Secondary | ICD-10-CM | POA: Diagnosis not present

## 2018-09-20 DIAGNOSIS — Z87440 Personal history of urinary (tract) infections: Secondary | ICD-10-CM | POA: Diagnosis not present

## 2018-09-20 DIAGNOSIS — Z7982 Long term (current) use of aspirin: Secondary | ICD-10-CM | POA: Diagnosis not present

## 2018-09-20 DIAGNOSIS — Z880 Allergy status to penicillin: Secondary | ICD-10-CM | POA: Diagnosis not present

## 2018-09-20 DIAGNOSIS — E7800 Pure hypercholesterolemia, unspecified: Secondary | ICD-10-CM | POA: Diagnosis not present

## 2018-09-20 DIAGNOSIS — Z886 Allergy status to analgesic agent status: Secondary | ICD-10-CM | POA: Diagnosis not present

## 2018-09-20 DIAGNOSIS — Z885 Allergy status to narcotic agent status: Secondary | ICD-10-CM | POA: Diagnosis not present

## 2018-09-20 DIAGNOSIS — I2510 Atherosclerotic heart disease of native coronary artery without angina pectoris: Secondary | ICD-10-CM | POA: Diagnosis not present

## 2018-09-20 DIAGNOSIS — I10 Essential (primary) hypertension: Secondary | ICD-10-CM | POA: Diagnosis not present

## 2018-09-20 DIAGNOSIS — F419 Anxiety disorder, unspecified: Secondary | ICD-10-CM | POA: Diagnosis not present

## 2018-09-20 DIAGNOSIS — F329 Major depressive disorder, single episode, unspecified: Secondary | ICD-10-CM | POA: Diagnosis not present

## 2018-09-20 DIAGNOSIS — Z79899 Other long term (current) drug therapy: Secondary | ICD-10-CM | POA: Diagnosis not present

## 2018-09-20 DIAGNOSIS — G629 Polyneuropathy, unspecified: Secondary | ICD-10-CM | POA: Diagnosis not present

## 2018-09-20 DIAGNOSIS — Z87442 Personal history of urinary calculi: Secondary | ICD-10-CM | POA: Diagnosis not present

## 2018-10-18 DIAGNOSIS — R630 Anorexia: Secondary | ICD-10-CM | POA: Diagnosis not present

## 2018-10-18 DIAGNOSIS — N390 Urinary tract infection, site not specified: Secondary | ICD-10-CM | POA: Diagnosis not present

## 2018-10-18 DIAGNOSIS — E785 Hyperlipidemia, unspecified: Secondary | ICD-10-CM | POA: Diagnosis not present

## 2018-10-18 DIAGNOSIS — M545 Low back pain: Secondary | ICD-10-CM | POA: Diagnosis not present

## 2018-10-30 DIAGNOSIS — M25512 Pain in left shoulder: Secondary | ICD-10-CM | POA: Diagnosis not present

## 2018-10-30 DIAGNOSIS — F419 Anxiety disorder, unspecified: Secondary | ICD-10-CM | POA: Diagnosis not present

## 2018-10-30 DIAGNOSIS — Z7982 Long term (current) use of aspirin: Secondary | ICD-10-CM | POA: Diagnosis not present

## 2018-10-30 DIAGNOSIS — S46912A Strain of unspecified muscle, fascia and tendon at shoulder and upper arm level, left arm, initial encounter: Secondary | ICD-10-CM | POA: Diagnosis not present

## 2018-10-30 DIAGNOSIS — E785 Hyperlipidemia, unspecified: Secondary | ICD-10-CM | POA: Diagnosis not present

## 2018-10-30 DIAGNOSIS — K219 Gastro-esophageal reflux disease without esophagitis: Secondary | ICD-10-CM | POA: Diagnosis not present

## 2018-10-30 DIAGNOSIS — I10 Essential (primary) hypertension: Secondary | ICD-10-CM | POA: Diagnosis not present

## 2018-10-30 DIAGNOSIS — Z79899 Other long term (current) drug therapy: Secondary | ICD-10-CM | POA: Diagnosis not present

## 2018-10-30 DIAGNOSIS — I2510 Atherosclerotic heart disease of native coronary artery without angina pectoris: Secondary | ICD-10-CM | POA: Diagnosis not present

## 2018-10-30 DIAGNOSIS — F17210 Nicotine dependence, cigarettes, uncomplicated: Secondary | ICD-10-CM | POA: Diagnosis not present

## 2018-11-05 DIAGNOSIS — M25512 Pain in left shoulder: Secondary | ICD-10-CM | POA: Diagnosis not present

## 2018-11-12 IMAGING — US US RENAL
1 series · 14 of 21 positions shown · non-contrast
Comparison: None.

CLINICAL DATA: Urinary tract infection

EXAM:
RENAL / URINARY TRACT ULTRASOUND COMPLETE

[Series 1: us renal · 0.22mm/px · 14 of 21 slices shown]
[im 1/21]
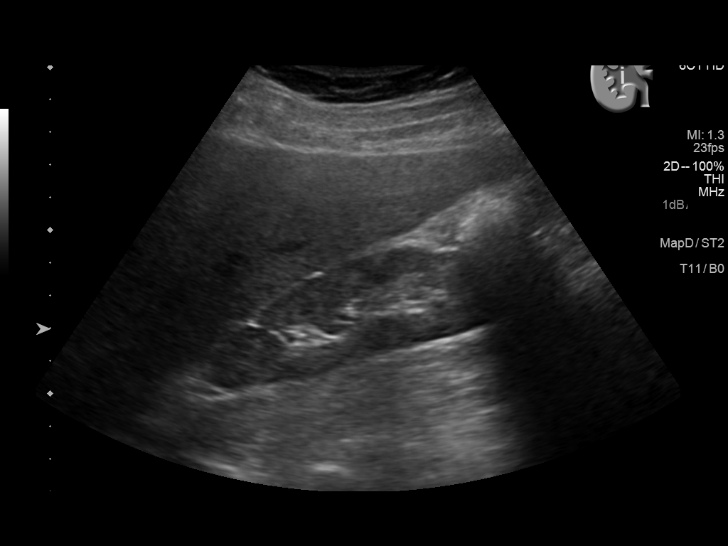
[im 3/21]
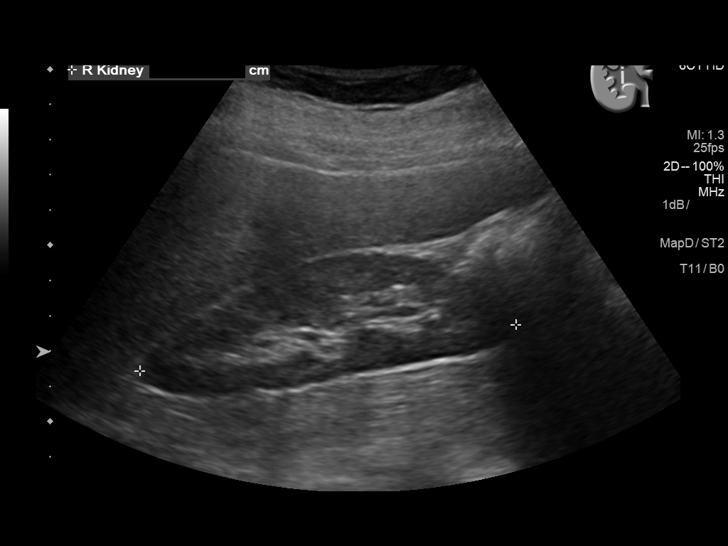
[im 4/21]
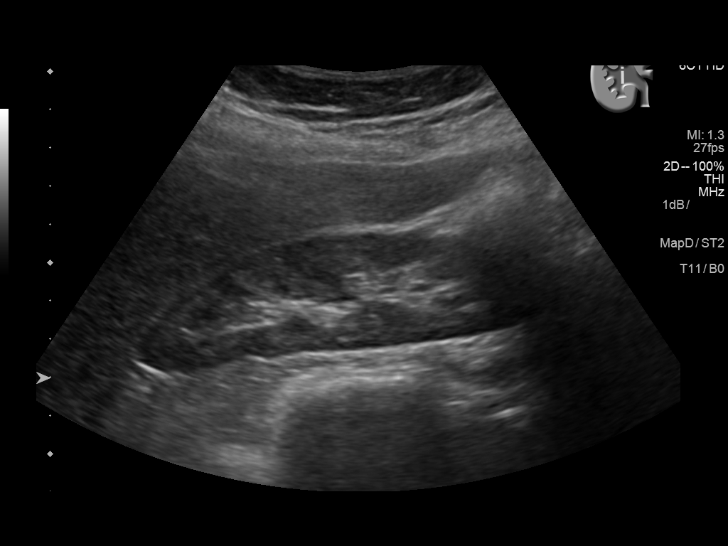
[im 6/21]
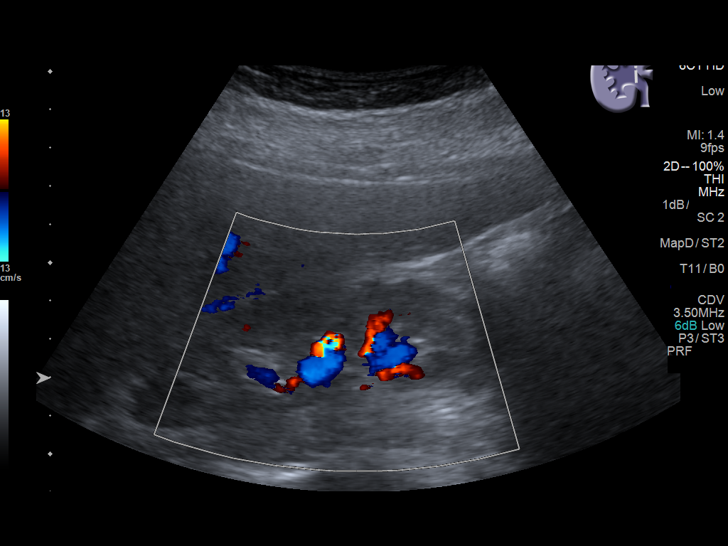
[im 7/21]
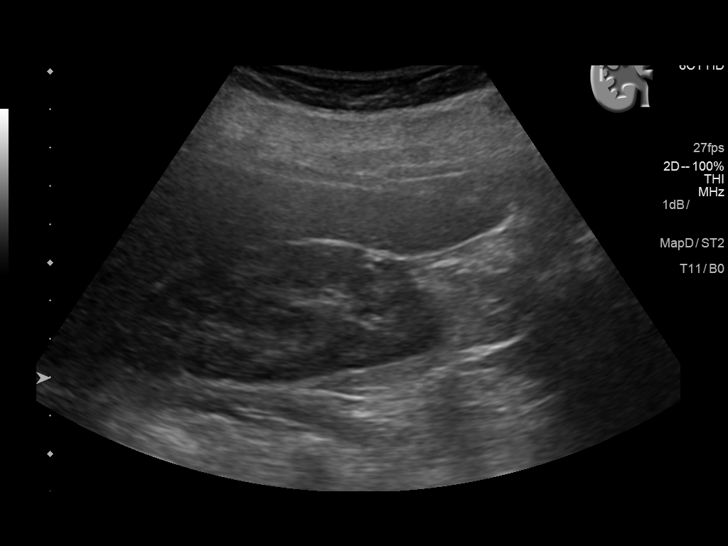
[im 9/21]
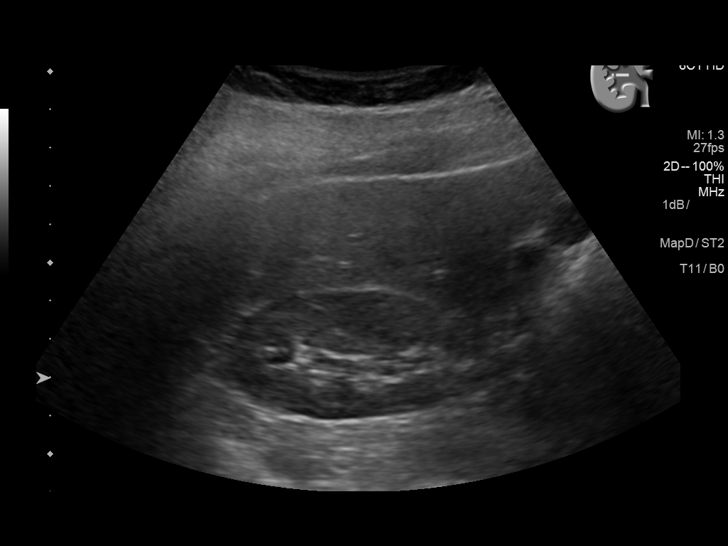
[im 10/21]
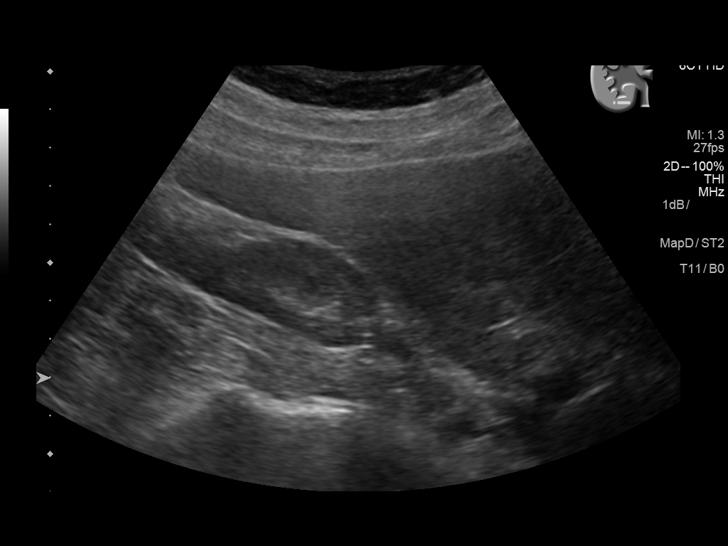
[im 12/21]
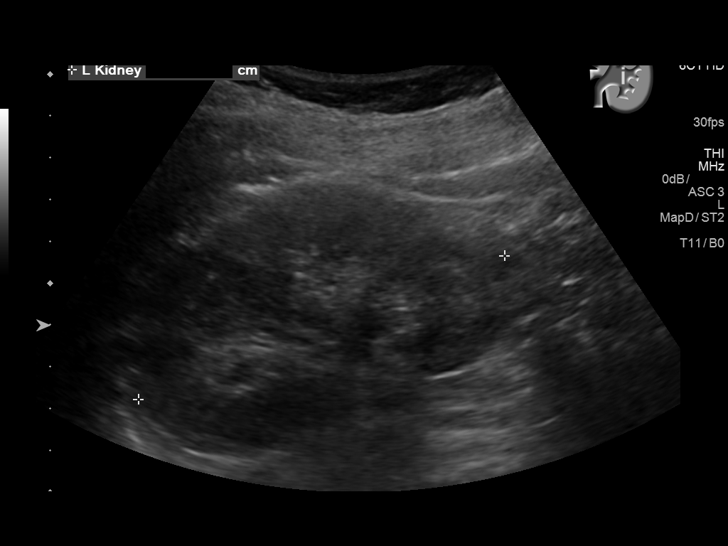
[im 13/21]
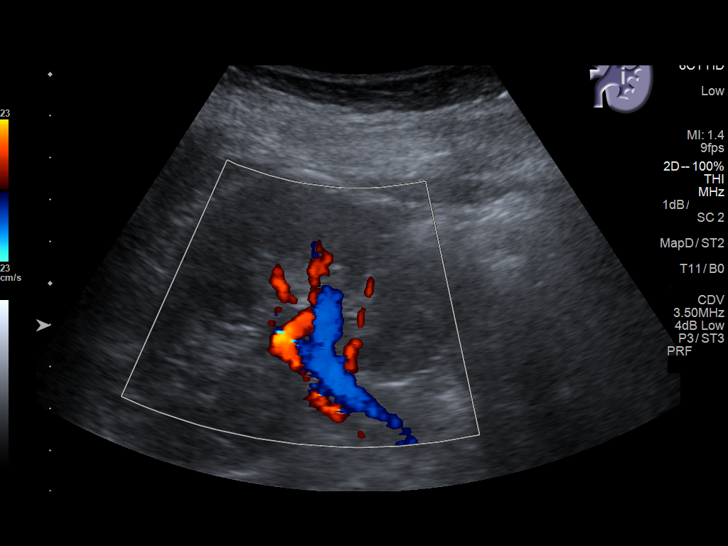
[im 15/21]
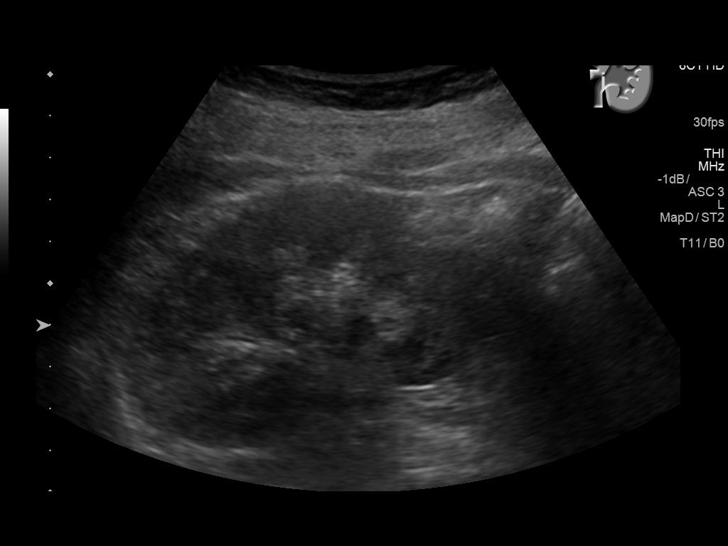
[im 16/21]
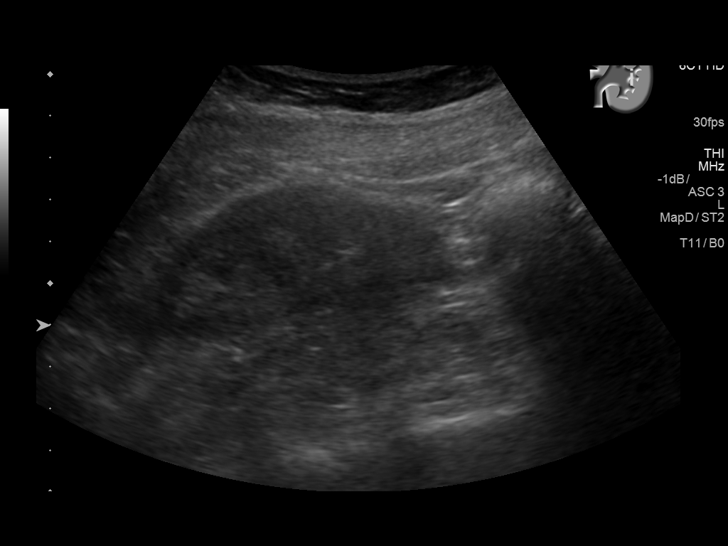
[im 18/21]
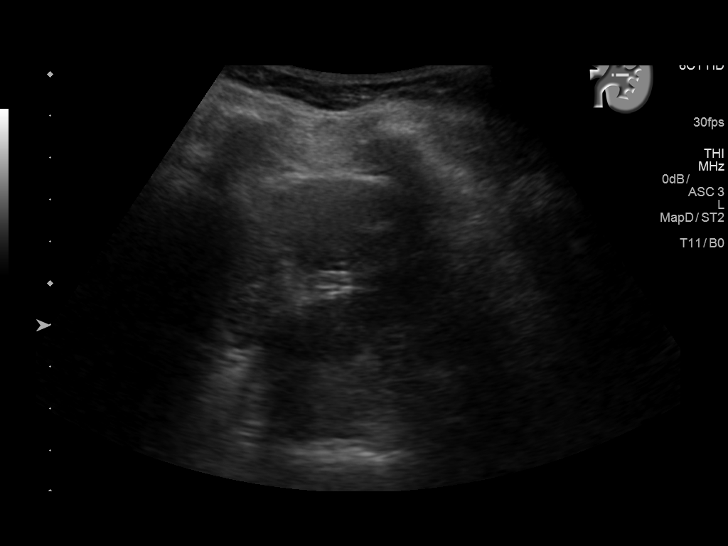
[im 19/21]
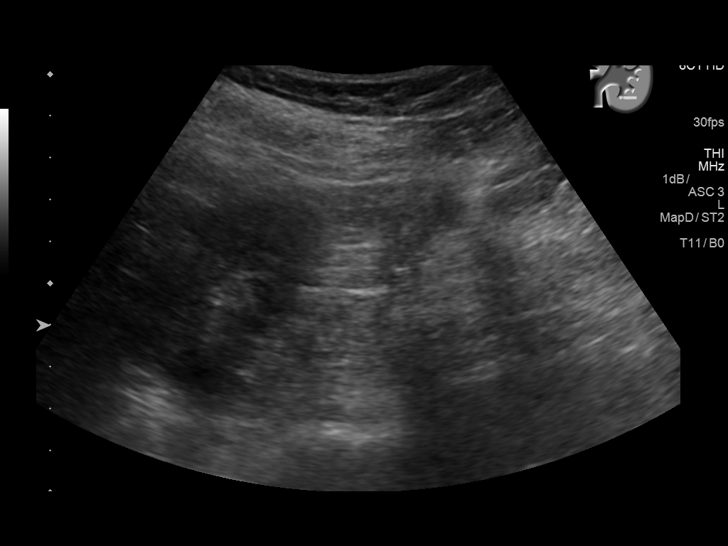
[im 21/21]
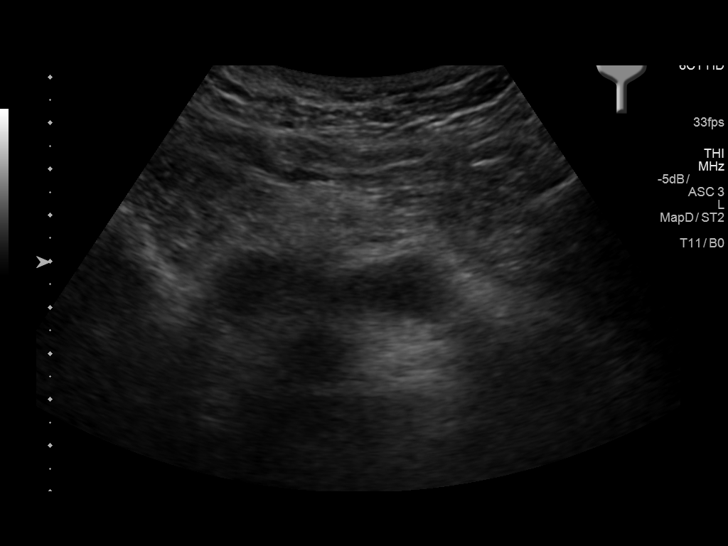

[14 of 21 positions shown; findings below may reference images not displayed]

FINDINGS: Right Kidney:

Length: 10.8 cm. Echogenicity within normal limits. No mass or
hydronephrosis visualized.

Left Kidney:

Length: 9.4 cm. Echogenicity within normal limits. No mass or
hydronephrosis visualized.

Bladder:

The bladder was nondistended, limiting visualization.
IMPRESSION: Normal renal ultrasound.

## 2018-11-14 DIAGNOSIS — M5441 Lumbago with sciatica, right side: Secondary | ICD-10-CM | POA: Diagnosis not present

## 2018-11-14 DIAGNOSIS — M545 Low back pain: Secondary | ICD-10-CM | POA: Diagnosis not present

## 2018-11-14 DIAGNOSIS — I10 Essential (primary) hypertension: Secondary | ICD-10-CM | POA: Diagnosis not present

## 2018-11-14 DIAGNOSIS — Z791 Long term (current) use of non-steroidal anti-inflammatories (NSAID): Secondary | ICD-10-CM | POA: Diagnosis not present

## 2018-11-14 DIAGNOSIS — Z79899 Other long term (current) drug therapy: Secondary | ICD-10-CM | POA: Diagnosis not present

## 2018-11-14 DIAGNOSIS — F17210 Nicotine dependence, cigarettes, uncomplicated: Secondary | ICD-10-CM | POA: Diagnosis not present

## 2018-12-06 DIAGNOSIS — G5601 Carpal tunnel syndrome, right upper limb: Secondary | ICD-10-CM | POA: Diagnosis not present
